# Patient Record
Sex: Male | Born: 2000 | Race: Black or African American | Hispanic: No | Marital: Single | Smoking: Current some day smoker
Health system: Southern US, Community
[De-identification: ages and names within clinical notes are randomized; demographics above are authoritative.]

## PROBLEM LIST (undated history)

## (undated) DIAGNOSIS — F419 Anxiety disorder, unspecified: Secondary | ICD-10-CM

## (undated) DIAGNOSIS — F909 Attention-deficit hyperactivity disorder, unspecified type: Secondary | ICD-10-CM

## (undated) DIAGNOSIS — J45909 Unspecified asthma, uncomplicated: Secondary | ICD-10-CM

## (undated) DIAGNOSIS — Q999 Chromosomal abnormality, unspecified: Secondary | ICD-10-CM

## (undated) DIAGNOSIS — W3400XA Accidental discharge from unspecified firearms or gun, initial encounter: Secondary | ICD-10-CM

## (undated) HISTORY — PX: PELVIC FRACTURE SURGERY: SHX119

---
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
## 2021-01-03 ENCOUNTER — Emergency Department (HOSPITAL_COMMUNITY)
Admission: EM | Admit: 2021-01-03 | Discharge: 2021-01-06 | Disposition: A | Discharge status: Other Acute Inpatient Hospital | Payer: Medicaid Other | Attending: Emergency Medicine | Admitting: Emergency Medicine

## 2021-01-03 DIAGNOSIS — F332 Major depressive disorder, recurrent severe without psychotic features: Secondary | ICD-10-CM | POA: Diagnosis not present

## 2021-01-03 DIAGNOSIS — Y900 Blood alcohol level of less than 20 mg/100 ml: Secondary | ICD-10-CM | POA: Diagnosis not present

## 2021-01-03 DIAGNOSIS — J45909 Unspecified asthma, uncomplicated: Secondary | ICD-10-CM | POA: Diagnosis not present

## 2021-01-03 DIAGNOSIS — R259 Unspecified abnormal involuntary movements: Secondary | ICD-10-CM | POA: Insufficient documentation

## 2021-01-03 DIAGNOSIS — F17200 Nicotine dependence, unspecified, uncomplicated: Secondary | ICD-10-CM | POA: Insufficient documentation

## 2021-01-03 DIAGNOSIS — Z046 Encounter for general psychiatric examination, requested by authority: Secondary | ICD-10-CM

## 2021-01-03 DIAGNOSIS — R45851 Suicidal ideations: Secondary | ICD-10-CM | POA: Diagnosis not present

## 2021-01-03 DIAGNOSIS — F333 Major depressive disorder, recurrent, severe with psychotic symptoms: Secondary | ICD-10-CM | POA: Diagnosis not present

## 2021-01-03 DIAGNOSIS — Z20822 Contact with and (suspected) exposure to covid-19: Secondary | ICD-10-CM | POA: Diagnosis not present

## 2021-01-03 LAB — COMPREHENSIVE METABOLIC PANEL
ALT: 17 U/L (ref 0–44)
AST: 29 U/L (ref 15–41)
Albumin: 4.5 g/dL (ref 3.5–5.0)
Alkaline Phosphatase: 65 U/L (ref 38–126)
Anion gap: 8 (ref 5–15)
BUN: 11 mg/dL (ref 6–20)
CO2: 27 mmol/L (ref 22–32)
Calcium: 9.6 mg/dL (ref 8.9–10.3)
Chloride: 103 mmol/L (ref 98–111)
Creatinine, Ser: 1.27 mg/dL — ABNORMAL HIGH (ref 0.61–1.24)
GFR, Estimated: >60 mL/min (ref >60)
Glucose, Bld: 100 mg/dL — ABNORMAL HIGH (ref 70–99)
Potassium: 4.2 mmol/L (ref 3.5–5.1)
Sodium: 138 mmol/L (ref 135–145)
Total Bilirubin: 0.3 mg/dL (ref 0.3–1.2)
Total Protein: 6.8 g/dL (ref 6.5–8.1)

## 2021-01-03 LAB — CBC WITH DIFFERENTIAL/PLATELET
Abs Immature Granulocytes: 0.01 10*3/uL (ref 0.00–0.07)
Basophils Absolute: 0 10*3/uL (ref 0.0–0.1)
Basophils Relative: 1 %
Eosinophils Absolute: 0.3 10*3/uL (ref 0.0–0.5)
Eosinophils Relative: 6 %
HCT: 42.2 % (ref 39.0–52.0)
Hemoglobin: 14.5 g/dL (ref 13.0–17.0)
Immature Granulocytes: 0 %
Lymphocytes Relative: 46 %
Lymphs Abs: 2.2 10*3/uL (ref 0.7–4.0)
MCH: 27.3 pg (ref 26.0–34.0)
MCHC: 34.4 g/dL (ref 30.0–36.0)
MCV: 79.3 fL — ABNORMAL LOW (ref 80.0–100.0)
Monocytes Absolute: 0.3 10*3/uL (ref 0.1–1.0)
Monocytes Relative: 6 %
Neutro Abs: 2 10*3/uL (ref 1.7–7.7)
Neutrophils Relative %: 41 %
Platelets: 259 10*3/uL (ref 150–400)
RBC: 5.32 MIL/uL (ref 4.22–5.81)
RDW: 13.2 % (ref 11.5–15.5)
WBC: 4.7 10*3/uL (ref 4.0–10.5)
nRBC: 0 % (ref 0.0–0.2)

## 2021-01-03 LAB — ACETAMINOPHEN LEVEL: Acetaminophen (Tylenol), Serum: <10 ug/mL — ABNORMAL LOW (ref 10–30)

## 2021-01-03 LAB — SALICYLATE LEVEL: Salicylate Lvl: <7 mg/dL — ABNORMAL LOW (ref 7.0–30.0)

## 2021-01-03 LAB — ETHANOL: Alcohol, Ethyl (B): <10 mg/dL (ref <10)

## 2021-01-03 NOTE — ED Triage Notes (Signed)
Pt BIB [facility] under IVC from mother, states she is concerned about SI. Pt calm, cooperative in triage. Hx MDD, PTSD, MDD, mother states noncompliance w meds

## 2021-01-03 NOTE — ED Provider Notes (Signed)
Emergency Medicine Provider Triage Evaluation Note  Roy Dudley , a 20 y.o. male  was evaluated in triage.  Pt complains of suicidal ideations.  Prior history of PTSD, MDD, according to mother he is not taking any medication.  He has placed him under IVC.  Prior attempt of SI, consistent with hurting and cutting his arms.  No visual auditory hallucinations.  Review of Systems  Positive: Suicidal ideations Negative: HI, hallucinations.  Physical Exam  BP 113/67 (BP Location: Right Arm)   Pulse 83   Temp 97.8 F (36.6 C) (Oral)   Resp 18   SpO2 98%  Gen:   Awake, no distress  withdrawn Resp:  Normal effort  MSK:   Moves extremities without difficulty  Other:    Medical Decision Making  Medically screening exam initiated at 7:53 PM.  Appropriate orders placed.  Melo Longo was informed that the remainder of the evaluation will be completed by another provider, this initial triage assessment does not replace that evaluation, and the importance of remaining in the ED until their evaluation is complete.  Patient under IVC from mother, prior history of mental health noncompliant with medication.   Claude Manges, PA-C 01/03/21 1955    Margarita Grizzle, MD 01/10/21 [phone]

## 2021-01-04 ENCOUNTER — Encounter (HOSPITAL_COMMUNITY): Payer: Self-pay | Admitting: Registered Nurse

## 2021-01-04 DIAGNOSIS — F333 Major depressive disorder, recurrent, severe with psychotic symptoms: Secondary | ICD-10-CM | POA: Diagnosis not present

## 2021-01-04 LAB — RAPID URINE DRUG SCREEN, HOSP PERFORMED
Amphetamines: NOT DETECTED
Barbiturates: NOT DETECTED
Benzodiazepines: NOT DETECTED
Cocaine: NOT DETECTED
Opiates: NOT DETECTED
Tetrahydrocannabinol: POSITIVE — AB

## 2021-01-04 LAB — RESP PANEL BY RT-PCR (FLU A&B, COVID) ARPGX2
Influenza A by PCR: NEGATIVE
Influenza B by PCR: NEGATIVE
SARS Coronavirus 2 by RT PCR: NEGATIVE

## 2021-01-04 MED ORDER — LORAZEPAM 2 MG/ML IJ SOLN
2.0000 mg | Freq: Once | INTRAMUSCULAR | Status: AC | PRN
  Administered 2021-01-04: 2 mg via INTRAMUSCULAR
  Filled 2021-01-04: qty 1

## 2021-01-04 MED ORDER — OLANZAPINE 5 MG PO TBDP
5.0000 mg | ORAL_TABLET | Freq: Every day | ORAL | Status: DC
  Administered 2021-01-05 – 2021-01-06 (×2): 5 mg via ORAL
  Filled 2021-01-04 (×2): qty 1

## 2021-01-04 MED ORDER — ACETAMINOPHEN 325 MG PO TABS
650.0000 mg | ORAL_TABLET | ORAL | Status: DC | PRN
Start: 2021-01-04 — End: 2021-01-06

## 2021-01-04 MED ORDER — NICOTINE 21 MG/24HR TD PT24
21.0000 mg | MEDICATED_PATCH | Freq: Every day | TRANSDERMAL | Status: DC
  Administered 2021-01-05 – 2021-01-06 (×2): 21 mg via TRANSDERMAL
  Filled 2021-01-04 (×3): qty 1

## 2021-01-04 NOTE — ED Notes (Signed)
Patient made his first phone call.

## 2021-01-04 NOTE — ED Notes (Signed)
Pt very agitated, slamming his door and making very loud noises in his room. Pt was asked to calm down but no success. Security was needed to calm patient down. Pt remained very angry and agitated and was refusing ativan. Security and this RN explained to the patient why ativan is needed. Also was under the impression that he can just sign himself out because he is now "20 years old".Explained to the patient that he has IVC orders which he could not comprehend what it entailed. Talked to MD that patient might need to be held down to give ativan. Medicine was pulled from the pyxis. After a long calm explanations, the patient agreed to getting the ativan. Pt was not held down. Dr. Estell Harpin was informed that ativan was given and that security did not have to restraint him.

## 2021-01-04 NOTE — ED Notes (Signed)
Pt belongings inventoried and placed in locker #8.

## 2021-01-04 NOTE — Progress Notes (Signed)
Pt accepted to [facility]      Patient meets inpatient criteria per Assunta Found, NP   Dr. Minna Merritts the attending provider.    Call report to [phone]   Mariane Duval, RN @ [facility] ED notified.     Pt scheduled  to arrive at [facility] tomorrow after 0800.   Damita Dunnings, MSW, LCSW-A  1:36 PM 01/04/2021

## 2021-01-04 NOTE — ED Notes (Signed)
RN was made aware by staff that pt had phone since being in lobby. RN explained to pt that pt was not allowed to have phone at this time. PT became agitated stating, " I want to fucking leave. You can't keep me here. I want my phone". Pt also stated that, " I will leave." Security called. MD made aware. RN able to redirect pt back to bed.

## 2021-01-04 NOTE — ED Notes (Signed)
Breakfast tray ordered at this time.
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
## 2021-01-04 NOTE — ED Notes (Signed)
Assumed care of pt at this time. Pt lying in bed with eyes closed and respirations even and unlabored. No distress noted, will continue to monitor.

## 2021-01-04 NOTE — B[facility] Counselor (Signed)
Requested cart at 730.

## 2021-01-04 NOTE — ED Notes (Signed)
Sitter reported that pt has a low heart rate. Talked to patient, he is resting comfortably watching TV. Radial pulses are strong and even.

## 2021-01-04 NOTE — ED Notes (Signed)
Pt refused CT and zyprexa. Shuvon Rankin notified via secure chat.

## 2021-01-04 NOTE — ED Notes (Signed)
Messaged Dr. Estell Harpin to discontinue restraint order.

## 2021-01-04 NOTE — ED Notes (Signed)
Another breakfast tray ordered. Pt refused 1st breakfast tray. Pt states food is old.
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
## 2021-01-04 NOTE — Progress Notes (Signed)
Patient has been faxed out due to COVID restrictions at [facility]. Patient meets inpatient criteria per [facility] Rankin,NP. Patient referred to the following facilities: [facility]  [address] [place] [address] [phone]  [facility]  [address] [place] [address] [phone]  [facility]  [address] [place] [address] [phone]  [facility]  [address] [place] [address] [phone]  [facility]  [address] [place], [place] [address] [phone]  [facility]  [address] [place], [place] [address] [phone]  [facility]  [address] [place] [address] [phone]  [facility]  [address], [place] [address] [phone]  [facility]  [address] [place] [address] [phone]     CSW will continue to monitor disposition.    Damita Dunnings, MSW, LCSW-A  1:02 PM 01/04/2021

## 2021-01-05 ENCOUNTER — Emergency Department (HOSPITAL_COMMUNITY): Payer: Medicaid Other

## 2021-01-05 MED ORDER — ZIPRASIDONE MESYLATE 20 MG IM SOLR
20.0000 mg | Freq: Once | INTRAMUSCULAR | Status: DC
  Filled 2021-01-05: qty 20

## 2021-01-05 MED ORDER — STERILE WATER FOR INJECTION IJ SOLN
INTRAMUSCULAR | Status: AC
  Filled 2021-01-05: qty 10

## 2021-01-05 NOTE — ED Notes (Signed)
Patient talking to his mother on the phone at this time.

## 2021-01-05 NOTE — ED Notes (Signed)
Hospital public safety at the bedside. He is yelling and throwing things in his room. He punched the wall with his right hand. Bleeding is controled at this time.

## 2021-01-05 NOTE — ED Notes (Signed)
Patient is out in the hallway trying to call his mother at this time.

## 2021-01-05 NOTE — ED Notes (Signed)
Patient given a Snack and Drink.

## 2021-01-05 NOTE — ED Notes (Signed)
Received verbal report from Andrew F RN at this time 

## 2021-01-05 NOTE — ED Notes (Signed)
ED Provider at bedside. 

## 2021-01-05 NOTE — ED Notes (Signed)
Pt requesting something to drink and crackers with peanut butter at this time

## 2021-01-05 NOTE — ED Notes (Signed)
I attempted to call nurse report to [facility] this morning. RN-Silva said that she cannot take the patient until 24hr after his last IM medication dose. That would be around 3pm today if he doesn't need another shot.

## 2021-01-05 NOTE — ED Notes (Signed)
Patient states that he will calm down and doesn't need the shot of Geodon at this time. I infromed him that is he acts up again he will be getting the shot. Lights turned off and food at the bedside.

## 2021-01-05 NOTE — BH Assessment (Addendum)
Disposition:  Per Shuvon Rankin, NP, Pt meets inpt criteria due to suicidal and homicidal ideation, as well as delusion. Pt accepted to [facility]. Pt scheduled  to arrive at [facility] tomorrow after 0800. The accepting and attending provider is Dr. Minna Merritts. Call report to [phone]. Mariane Duval, RN @ [facility] ED notified.   Upon chart review @0901 , , RN noted: " I attempted to call nurse report to [facility] this morning. RN-Silva said that she cannot take the patient until 24hr after his last IM medication dose". That would be around 3pm today if he doesn't need another shot".   Disposition LCSW and/or patient's nurse to contact [facility] 8/12 to provide updates regarding patient's behavioral status and elgilbility for transfer.

## 2021-01-05 NOTE — ED Provider Notes (Signed)
Emergency Medicine Observation Re-evaluation Note  Roy Dudley is a 20 y.o. male, seen on rounds today.  Pt initially presented to the ED for complaints of IVC and Suicidal (Pt is under IVC (mother is petitioner) for communicating suicidal ideation, homicidal ideation, and exhibiting paranoia.) Currently, the patient is agitated, yelling and cursing because he is being held under IVC.  Physical Exam  BP 119/62 (BP Location: Left Arm)   Pulse (!) 40   Temp 98.3 F (36.8 C) (Oral)   Resp 18   SpO2 100%  Physical Exam General: Agitated Lungs: No distress, yelling and screaming Psych: Agitated, cursing  ED Course / MDM  EKG:   I have reviewed the labs performed to date as well as medications administered while in observation.  Recent changes in the last 24 hours include increased outbursts.  Plan  Current plan is for geodon for sedation. Planning admission to [facility] later today. Dr. Merlene Morse accepting per TTS notes Waymond Caddell is under involuntary commitment.      Pollyann Savoy, MD 01/05/21 [phone]

## 2021-01-06 NOTE — ED Notes (Addendum)
Patient currently talking in the shower by himself.

## 2021-01-06 NOTE — ED Notes (Signed)
Patient to be transported to [facility] under IVC at 1100 today. Patient aware and agreeable to plan.

## 2021-01-06 NOTE — ED Notes (Signed)
Sheriff on unit to transfer pt to [facility] per MD order. Personal property given to sheriff for transport. No s/s of acute distress noted at discharge. Ambulatory off unit in law enforcemnt custody.

## 2021-01-06 NOTE — ED Provider Notes (Signed)
Emergency Medicine Observation Re-evaluation Note  Lysander Calixte is a 20 y.o. male, seen on rounds today.  Pt initially presented to the ED for complaints of IVC and Suicidal (Pt is under IVC (mother is petitioner) for communicating suicidal ideation, homicidal ideation, and exhibiting paranoia.) Currently, the patient is IVC.  Physical Exam  BP 114/63 (BP Location: Left Arm)   Pulse (!) 40   Temp 98.4 F (36.9 C) (Oral)   Resp 16   SpO2 100%  Physical Exam General: wdwn male Cardiac: bradycardia Lungs: cta Psych: manic  ED Course / MDM  EKG:   I have reviewed the labs performed to date as well as medications administered while in observation.  Recent changes in the last 24 hours include patient with bed at [facility].  Plan  Current plan is for transfer to [facility]. Emmet Barrales is under involuntary commitment.      Margarita Grizzle, MD 01/06/21 [phone]

## 2021-05-13 ENCOUNTER — Encounter (HOSPITAL_COMMUNITY): Payer: Self-pay | Admitting: Emergency Medicine

## 2021-05-13 ENCOUNTER — Ambulatory Visit (HOSPITAL_COMMUNITY)
Admission: EM | Admit: 2021-05-13 | Discharge: 2021-05-13 | Disposition: A | Discharge status: Home / Self Care | Payer: Medicaid Other | Attending: Physician Assistant | Admitting: Physician Assistant

## 2021-05-13 ENCOUNTER — Other Ambulatory Visit: Payer: Self-pay

## 2021-05-13 DIAGNOSIS — J069 Acute upper respiratory infection, unspecified: Secondary | ICD-10-CM | POA: Diagnosis not present

## 2021-05-13 DIAGNOSIS — J4521 Mild intermittent asthma with (acute) exacerbation: Secondary | ICD-10-CM | POA: Diagnosis not present

## 2021-05-13 MED ORDER — ALBUTEROL SULFATE HFA 108 (90 BASE) MCG/ACT IN AERS
INHALATION_SPRAY | RESPIRATORY_TRACT | Status: AC
  Filled 2021-05-13: qty 6.7

## 2021-05-13 MED ORDER — ALBUTEROL SULFATE HFA 108 (90 BASE) MCG/ACT IN AERS
2.0000 | INHALATION_SPRAY | Freq: Once | RESPIRATORY_TRACT | Status: AC
Start: 2021-05-13 — End: 2021-05-13
  Administered 2021-05-13: 2 via RESPIRATORY_TRACT

## 2021-05-13 MED ORDER — PREDNISONE 20 MG PO TABS: 20.0000 mg | ORAL_TABLET | Freq: Every day | ORAL | 0 refills | Status: AC

## 2021-05-13 NOTE — ED Triage Notes (Signed)
Patient c/o nonproductive cough x 3 days.   Patient denies fever at home. Patient denies N/V/D.   Patient endorses loss of taste and smell.   Patient has taken ibuprofen with no relief of symptoms.

## 2021-05-13 NOTE — Discharge Instructions (Addendum)
I believe that you have a virus that is exacerbating your asthma.  Please use albuterol inhaler every 4-6 hours as needed.  Take prednisone 20 mg in the morning for 3 days.  You should not take NSAIDs including aspirin, ibuprofen/Advil, naproxen/Aleve with this medication as it can cause stomach bleeding.  You can use Mucinex, Tylenol, Flonase for additional symptom relief.  Make sure you rest and drink plenty of fluid.  If you have any worsening symptoms including high fever, shortness of breath, chest pain, worsening cough, nausea, vomiting, weakness you need to be seen immediately.
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
## 2022-03-21 ENCOUNTER — Emergency Department (HOSPITAL_COMMUNITY)
Admission: EM | Admit: 2022-03-21 | Discharge: 2022-03-23 | Disposition: A | Discharge status: Home / Self Care | Payer: Medicaid Other | Attending: Emergency Medicine | Admitting: Emergency Medicine

## 2022-03-21 ENCOUNTER — Other Ambulatory Visit: Payer: Self-pay

## 2022-03-21 ENCOUNTER — Encounter (HOSPITAL_COMMUNITY): Payer: Self-pay | Admitting: Emergency Medicine

## 2022-03-21 DIAGNOSIS — F4310 Post-traumatic stress disorder, unspecified: Secondary | ICD-10-CM | POA: Diagnosis present

## 2022-03-21 DIAGNOSIS — F22 Delusional disorders: Secondary | ICD-10-CM | POA: Insufficient documentation

## 2022-03-21 DIAGNOSIS — F29 Unspecified psychosis not due to a substance or known physiological condition: Secondary | ICD-10-CM

## 2022-03-21 DIAGNOSIS — Z79899 Other long term (current) drug therapy: Secondary | ICD-10-CM | POA: Diagnosis not present

## 2022-03-21 DIAGNOSIS — Z20822 Contact with and (suspected) exposure to covid-19: Secondary | ICD-10-CM | POA: Diagnosis not present

## 2022-03-21 DIAGNOSIS — Z046 Encounter for general psychiatric examination, requested by authority: Secondary | ICD-10-CM | POA: Diagnosis present

## 2022-03-21 LAB — CBC
HCT: 42.4 % (ref 39.0–52.0)
Hemoglobin: 14.7 g/dL (ref 13.0–17.0)
MCH: 27.2 pg (ref 26.0–34.0)
MCHC: 34.7 g/dL (ref 30.0–36.0)
MCV: 78.4 fL — ABNORMAL LOW (ref 80.0–100.0)
Platelets: 228 10*3/uL (ref 150–400)
RBC: 5.41 MIL/uL (ref 4.22–5.81)
RDW: 13.3 % (ref 11.5–15.5)
WBC: 8 10*3/uL (ref 4.0–10.5)
nRBC: 0 % (ref 0.0–0.2)

## 2022-03-21 LAB — COMPREHENSIVE METABOLIC PANEL
ALT: 19 U/L (ref 0–44)
AST: 53 U/L — ABNORMAL HIGH (ref 15–41)
Albumin: 4.7 g/dL (ref 3.5–5.0)
Alkaline Phosphatase: 57 U/L (ref 38–126)
Anion gap: 11 (ref 5–15)
BUN: 13 mg/dL (ref 6–20)
CO2: 23 mmol/L (ref 22–32)
Calcium: 9.6 mg/dL (ref 8.9–10.3)
Chloride: 107 mmol/L (ref 98–111)
Creatinine, Ser: 1.21 mg/dL (ref 0.61–1.24)
GFR, Estimated: >60 mL/min (ref >60)
Glucose, Bld: 79 mg/dL (ref 70–99)
Potassium: 3.7 mmol/L (ref 3.5–5.1)
Sodium: 141 mmol/L (ref 135–145)
Total Bilirubin: 1 mg/dL (ref 0.3–1.2)
Total Protein: 7 g/dL (ref 6.5–8.1)

## 2022-03-21 LAB — RAPID URINE DRUG SCREEN, HOSP PERFORMED
Amphetamines: NOT DETECTED
Barbiturates: NOT DETECTED
Benzodiazepines: NOT DETECTED
Cocaine: NOT DETECTED
Opiates: NOT DETECTED
Tetrahydrocannabinol: POSITIVE — AB

## 2022-03-21 LAB — URINALYSIS, ROUTINE W REFLEX MICROSCOPIC
Bilirubin Urine: NEGATIVE
Glucose, UA: NEGATIVE mg/dL
Hgb urine dipstick: NEGATIVE
Ketones, ur: 20 mg/dL — AB
Leukocytes,Ua: NEGATIVE
Nitrite: NEGATIVE
Protein, ur: 100 mg/dL — AB
Specific Gravity, Urine: 1.024 (ref 1.005–1.030)
pH: 5 (ref 5.0–8.0)

## 2022-03-21 LAB — ETHANOL: Alcohol, Ethyl (B): <10 mg/dL (ref <10)
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
## 2022-03-21 NOTE — ED Notes (Signed)
IVC PAPERWORK COMPLETED AND PLACED ON PURPLE CLIPBOARD IN ORANGE ZONE.
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
## 2022-03-21 NOTE — ED Notes (Signed)
Pt making phone call to update family an status. Pt is calm and cooperative Does not appear in any distress
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
## 2022-03-22 DIAGNOSIS — F29 Unspecified psychosis not due to a substance or known physiological condition: Secondary | ICD-10-CM

## 2022-03-22 DIAGNOSIS — F4310 Post-traumatic stress disorder, unspecified: Secondary | ICD-10-CM

## 2022-03-22 LAB — RESP PANEL BY RT-PCR (FLU A&B, COVID) ARPGX2
Influenza A by PCR: NEGATIVE
Influenza B by PCR: NEGATIVE
SARS Coronavirus 2 by RT PCR: NEGATIVE

## 2022-03-22 MED ORDER — LORAZEPAM 1 MG PO TABS
1.0000 mg | ORAL_TABLET | Freq: Four times a day (QID) | ORAL | Status: DC | PRN
  Administered 2022-03-22: 1 mg via ORAL
  Filled 2022-03-22: qty 1

## 2022-03-22 MED ORDER — LORAZEPAM 2 MG/ML IJ SOLN: 1.0000 mg | Freq: Four times a day (QID) | INTRAMUSCULAR | Status: DC | PRN

## 2022-03-22 MED ORDER — PRAZOSIN HCL 2 MG PO CAPS
3.0000 mg | ORAL_CAPSULE | Freq: Every day | ORAL | Status: DC
  Filled 2022-03-22: qty 1

## 2022-03-22 MED ORDER — OLANZAPINE 5 MG PO TBDP
5.0000 mg | ORAL_TABLET | Freq: Every day | ORAL | Status: DC
  Administered 2022-03-22: 5 mg via ORAL
  Filled 2022-03-22: qty 1

## 2022-03-22 MED ORDER — HYDROXYZINE HCL 25 MG PO TABS
25.0000 mg | ORAL_TABLET | Freq: Three times a day (TID) | ORAL | Status: DC | PRN
  Administered 2022-03-22 (×2): 25 mg via ORAL
  Filled 2022-03-22 (×2): qty 1

## 2022-03-22 MED ORDER — ZIPRASIDONE MESYLATE 20 MG IM SOLR: 20.0000 mg | Freq: Two times a day (BID) | INTRAMUSCULAR | Status: DC | PRN

## 2022-03-22 NOTE — ED Notes (Signed)
Pt has now made 2 phone calls and is notified that he cannot have anymore today

## 2022-03-22 NOTE — ED Notes (Signed)
Pt requested PRN anxiety medication. Pt provided medication, food, drink, and warm blankets. Pt calm and cooperative at this time, normal conversational tone.

## 2022-03-22 NOTE — ED Notes (Signed)
Pt showered and changed into new scrubs. Linen changed
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
## 2022-03-22 NOTE — Progress Notes (Addendum)
Inpatient Behavioral Health  Pt meets inpatient criteria per Molli Barrows,  NP.  Referral was sent to the following facilities;    Service Provider Address Phone Fax  [facility]  [address], [place] [address] [phone] [place]  [place], [place] [address] [phone]  [facility]  [address] [place] [address] [place]  [facility]  [address], [place] [address] [phone]  [place] [address] [place] [address] [phone]  [facility]  [address], [place] [address] [phone] [facility]  [address], [place] [address] [phone]  [facility]  [address], [place] [address] [phone] [facility]  [address], [place] [address] [phone]  [facility]  [address] [place] [address] [phone]   Situation ongoing,  CSW will follow up.   Benjaman Kindler, MSW, LCSWA 03/22/2022  @ 1:42 PM

## 2022-03-22 NOTE — Progress Notes (Signed)
Pt has been denied  at [facility] due to IDD. CSW will assist and follow with placement.   Benjaman Kindler, MSW, [facility] 03/22/2022 9:47 PM

## 2022-03-22 NOTE — ED Notes (Signed)
Review of patient clipboard in orange zone (patient currently in [facility]) indicates IVC'd on 03/21/2022 with an expiration of 03/28/2022.

## 2022-03-23 ENCOUNTER — Encounter (HOSPITAL_COMMUNITY): Payer: Self-pay | Admitting: *Deleted

## 2022-03-23 ENCOUNTER — Emergency Department (HOSPITAL_COMMUNITY)
Admission: EM | Admit: 2022-03-23 | Discharge: 2022-03-26 | Disposition: A | Discharge status: Home / Self Care | Payer: Medicaid Other | Attending: Emergency Medicine | Admitting: Emergency Medicine

## 2022-03-23 ENCOUNTER — Other Ambulatory Visit: Payer: Self-pay

## 2022-03-23 DIAGNOSIS — R45851 Suicidal ideations: Secondary | ICD-10-CM | POA: Insufficient documentation

## 2022-03-23 DIAGNOSIS — F4310 Post-traumatic stress disorder, unspecified: Secondary | ICD-10-CM | POA: Diagnosis present

## 2022-03-23 DIAGNOSIS — Z20822 Contact with and (suspected) exposure to covid-19: Secondary | ICD-10-CM | POA: Diagnosis not present

## 2022-03-23 DIAGNOSIS — F22 Delusional disorders: Secondary | ICD-10-CM | POA: Diagnosis not present

## 2022-03-23 DIAGNOSIS — F29 Unspecified psychosis not due to a substance or known physiological condition: Secondary | ICD-10-CM | POA: Diagnosis present

## 2022-03-23 DIAGNOSIS — Z046 Encounter for general psychiatric examination, requested by authority: Secondary | ICD-10-CM | POA: Diagnosis not present

## 2022-03-23 DIAGNOSIS — R456 Violent behavior: Secondary | ICD-10-CM | POA: Insufficient documentation

## 2022-03-23 DIAGNOSIS — R451 Restlessness and agitation: Secondary | ICD-10-CM | POA: Diagnosis not present

## 2022-03-23 LAB — URINALYSIS, ROUTINE W REFLEX MICROSCOPIC
Bilirubin Urine: NEGATIVE
Glucose, UA: NEGATIVE mg/dL
Hgb urine dipstick: NEGATIVE
Ketones, ur: NEGATIVE mg/dL
Leukocytes,Ua: NEGATIVE
Nitrite: NEGATIVE
Protein, ur: >=300 mg/dL — AB
Specific Gravity, Urine: 1.024 (ref 1.005–1.030)
pH: 5 (ref 5.0–8.0)

## 2022-03-23 LAB — SALICYLATE LEVEL: Salicylate Lvl: <7 mg/dL — ABNORMAL LOW (ref 7.0–30.0)

## 2022-03-23 LAB — COMPREHENSIVE METABOLIC PANEL
ALT: 18 U/L (ref 0–44)
AST: 35 U/L (ref 15–41)
Albumin: 4.4 g/dL (ref 3.5–5.0)
Alkaline Phosphatase: 63 U/L (ref 38–126)
Anion gap: 7 (ref 5–15)
BUN: 10 mg/dL (ref 6–20)
CO2: 29 mmol/L (ref 22–32)
Calcium: 9.6 mg/dL (ref 8.9–10.3)
Chloride: 107 mmol/L (ref 98–111)
Creatinine, Ser: 1.19 mg/dL (ref 0.61–1.24)
GFR, Estimated: >60 mL/min (ref >60)
Glucose, Bld: 86 mg/dL (ref 70–99)
Potassium: 3.5 mmol/L (ref 3.5–5.1)
Sodium: 143 mmol/L (ref 135–145)
Total Bilirubin: 0.7 mg/dL (ref 0.3–1.2)
Total Protein: 7 g/dL (ref 6.5–8.1)

## 2022-03-23 LAB — CBC WITH DIFFERENTIAL/PLATELET
Abs Immature Granulocytes: 0.01 10*3/uL (ref 0.00–0.07)
Basophils Absolute: 0 10*3/uL (ref 0.0–0.1)
Basophils Relative: 1 %
Eosinophils Absolute: 0.1 10*3/uL (ref 0.0–0.5)
Eosinophils Relative: 2 %
HCT: 42.5 % (ref 39.0–52.0)
Hemoglobin: 14.6 g/dL (ref 13.0–17.0)
Immature Granulocytes: 0 %
Lymphocytes Relative: 42 %
Lymphs Abs: 2.8 10*3/uL (ref 0.7–4.0)
MCH: 26.9 pg (ref 26.0–34.0)
MCHC: 34.4 g/dL (ref 30.0–36.0)
MCV: 78.4 fL — ABNORMAL LOW (ref 80.0–100.0)
Monocytes Absolute: 0.4 10*3/uL (ref 0.1–1.0)
Monocytes Relative: 6 %
Neutro Abs: 3.3 10*3/uL (ref 1.7–7.7)
Neutrophils Relative %: 49 %
Platelets: 257 10*3/uL (ref 150–400)
RBC: 5.42 MIL/uL (ref 4.22–5.81)
RDW: 13.3 % (ref 11.5–15.5)
WBC: 6.7 10*3/uL (ref 4.0–10.5)
nRBC: 0 % (ref 0.0–0.2)

## 2022-03-23 LAB — RAPID URINE DRUG SCREEN, HOSP PERFORMED
Amphetamines: NOT DETECTED
Barbiturates: NOT DETECTED
Benzodiazepines: POSITIVE — AB
Cocaine: NOT DETECTED
Opiates: NOT DETECTED
Tetrahydrocannabinol: POSITIVE — AB

## 2022-03-23 LAB — ACETAMINOPHEN LEVEL: Acetaminophen (Tylenol), Serum: <10 ug/mL — ABNORMAL LOW (ref 10–30)

## 2022-03-23 LAB — RESP PANEL BY RT-PCR (FLU A&B, COVID) ARPGX2
Influenza A by PCR: NEGATIVE
Influenza B by PCR: NEGATIVE
SARS Coronavirus 2 by RT PCR: NEGATIVE

## 2022-03-23 LAB — ETHANOL: Alcohol, Ethyl (B): <10 mg/dL (ref <10)

## 2022-03-23 MED ORDER — LORAZEPAM 2 MG/ML IJ SOLN
2.0000 mg | Freq: Once | INTRAMUSCULAR | Status: AC
  Administered 2022-03-23: 2 mg via INTRAMUSCULAR
  Filled 2022-03-23: qty 1

## 2022-03-23 MED ORDER — HALOPERIDOL LACTATE 5 MG/ML IJ SOLN
5.0000 mg | Freq: Once | INTRAMUSCULAR | Status: AC
  Administered 2022-03-23: 5 mg via INTRAMUSCULAR
  Filled 2022-03-23: qty 1

## 2022-03-23 MED ORDER — DIPHENHYDRAMINE HCL 50 MG/ML IJ SOLN
25.0000 mg | Freq: Once | INTRAMUSCULAR | Status: AC
  Administered 2022-03-23: 25 mg via INTRAMUSCULAR
  Filled 2022-03-23: qty 1

## 2022-03-23 NOTE — ED Notes (Signed)
Patient is resting comfortably. 

## 2022-03-23 NOTE — ED Notes (Signed)
Around 8:45 patient asked me to warm his food up , he than asked to use the phone , let him use the phone and he went back to his bed , around 930 I seen patient was not in his bed , I alert his nurse Paige RN and security than gpd , patient did not have purple scrubs on due to purple zone being locked up. Everyone made aware of patient missing.

## 2022-03-23 NOTE — ED Notes (Signed)
IVC paperwork complete and in orange zone 

## 2022-03-23 NOTE — ED Notes (Signed)
THE PT IS IVCD ???

## 2022-03-23 NOTE — ED Notes (Addendum)
Pt yelling at this RN and closing the door of other patients room. This RN requested that pt stop, pt states "you can't make me". Pt stating that the hospital has lost $300 shoes, new iphone, and clothing. EMT Lane attempting to locate personal belongings from previous visit when pt eloped. MD Trifan made aware of pts behaviors. Security to bedside.
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
## 2022-03-23 NOTE — ED Provider Triage Note (Cosign Needed Addendum)
Emergency Medicine Provider Triage Evaluation Note  Roy Dudley , a 21 y.o. male  was evaluated in triage.  Pt complains of SI states plan to shoot himself in the head. States he feels hopeless.   Hx of cutting. No hx of attempts of suicide.   Denies ingestion in attempt to self harm. Denies any attempt to kill himself before arrival at ED.   Also some burning occasionally when he pees. Recent sexual activity.   Review of Systems  Positive: SI Negative: Fever   Physical Exam  BP 115/66   Pulse 63   Temp 98.6 F (37 C) (Oral)   Resp 18   SpO2 97%  Gen:   Awake, no distress   Resp:  Normal effort  MSK:   Moves extremities without difficulty Other:    Medical Decision Making  Medically screening exam initiated at 8:02 PM.  Appropriate orders placed.  Roy Dudley was informed that the remainder of the evaluation will be completed by another provider, this initial triage assessment does not replace that evaluation, and the importance of remaining in the ED until their evaluation is complete.  Psych clearance labs     Tedd Sias, [place] 03/23/22 2004    Pati Gallo [place], [place] 03/23/22 2005

## 2022-03-23 NOTE — BH Assessment (Signed)
TTS attempted to see will follow up with Pt's nurse. 
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
## 2022-03-23 NOTE — ED Triage Notes (Signed)
THE PT  IS HERE WITH SI  HE APPARENTLY WAS HERE 10-26  AND ??? LEFT he came back today and he left to see his mother who is upstairs  he is ivcd   we are unable to find his belongings he is dressed out.  His plan is to step in front of  [employer]

## 2022-03-24 DIAGNOSIS — Z046 Encounter for general psychiatric examination, requested by authority: Secondary | ICD-10-CM | POA: Insufficient documentation

## 2022-03-24 DIAGNOSIS — F29 Unspecified psychosis not due to a substance or known physiological condition: Secondary | ICD-10-CM | POA: Diagnosis not present

## 2022-03-24 DIAGNOSIS — F4310 Post-traumatic stress disorder, unspecified: Secondary | ICD-10-CM | POA: Diagnosis not present

## 2022-03-24 MED ORDER — TRAZODONE HCL 50 MG PO TABS
50.0000 mg | ORAL_TABLET | Freq: Every day | ORAL | Status: DC
  Administered 2022-03-24 – 2022-03-25 (×2): 50 mg via ORAL
  Filled 2022-03-24 (×2): qty 1

## 2022-03-24 MED ORDER — PRAZOSIN HCL 1 MG PO CAPS
1.0000 mg | ORAL_CAPSULE | Freq: Every day | ORAL | Status: DC
  Administered 2022-03-24 – 2022-03-25 (×2): 1 mg via ORAL
  Filled 2022-03-24 (×3): qty 1

## 2022-03-24 MED ORDER — LORAZEPAM 1 MG PO TABS
1.0000 mg | ORAL_TABLET | Freq: Once | ORAL | Status: AC
  Administered 2022-03-24: 1 mg via ORAL
  Filled 2022-03-24: qty 1

## 2022-03-24 MED ORDER — OLANZAPINE 10 MG IM SOLR
5.0000 mg | Freq: Once | INTRAMUSCULAR | Status: AC
  Administered 2022-03-24: 5 mg via INTRAMUSCULAR
  Filled 2022-03-24 (×2): qty 10

## 2022-03-24 MED ORDER — OLANZAPINE 5 MG PO TBDP
5.0000 mg | ORAL_TABLET | Freq: Every day | ORAL | Status: DC
  Administered 2022-03-24 – 2022-03-25 (×2): 5 mg via ORAL
  Filled 2022-03-24 (×2): qty 1

## 2022-03-24 MED ORDER — HYDROXYZINE HCL 25 MG PO TABS
25.0000 mg | ORAL_TABLET | Freq: Three times a day (TID) | ORAL | Status: AC
  Administered 2022-03-24 (×2): 25 mg via ORAL
  Filled 2022-03-24 (×2): qty 1

## 2022-03-24 NOTE — ED Notes (Signed)
Patient given phone privileges at this time

## 2022-03-24 NOTE — ED Notes (Signed)
Pt returned to H20 at this time accompanied by [employer] and Security

## 2022-03-24 NOTE — ED Notes (Addendum)
Pt took a shower, scrubs changed, linen changed. Pt is now sitting at bedside.

## 2022-03-24 NOTE — ED Notes (Signed)
Patient currently making a phone call

## 2022-03-24 NOTE — ED Notes (Signed)
Charge nurse previously aware of pt being elopement risk. Pt did not have a sitter after 1100. Unable to move pt. Pt's primary nurse any myself were in another room assisting a critical patient and were notified that pt had eloped. Security notified and attempting to locate patient outside. Staffing called and no sitters available at this time. AC also called and notified of need for a sitter for IVC patient. Awaiting pt's return to ED.

## 2022-03-24 NOTE — ED Notes (Signed)
Patient currently calm and cooperative at this time.  Provided with ginger ale and graham crackers per request

## 2022-03-24 NOTE — ED Notes (Signed)
Patient alert and loud in the hallway Patient talking to anyone that is passing by. Patient is talking about getting out in 2 days and he does not care if we "let him leave"

## 2022-03-24 NOTE — Progress Notes (Signed)
Inpatient Behavioral Health Placement  Pt meets inpatient criteria per Molli Barrows, FNP. There are no available beds at [facility] per [facility] Lavell Luster, RN.   Referral was sent to the following facilities;   Destination Service Provider Address Phone Fax  [facility]  [place], [place] [address] [place]  [facility]  [address] [place] [address] [phone] [place]  [place], [place] [address] [place]  [facility]  [address], [place] [address] [phone]  [place] [address] [place] [address] [phone]  [facility]  [address], [place] [address] [phone] [place]  [address], [place] [address] [phone] [facility]  [address], [place] [address] [phone] [facility]  [address] [place] [address] [place]  [address] [place] [address] [phone] [facility]  [address], [place] [address] [phone] [facility]  [address] [place] [address] [phone] [facility]  [address], [place] [address] [phone] [facility]  [place], [place] [address] [phone]  [facility]  [address] [place] [address] [place]  [place], [place] [address] [phone]  [facility]  [place] [address] [place] [address] [phone] [facility]  [place] [address] [phone]  [facility]  [address] [place] [address] [phone]  [facility]  [address], [place] [address] [phone]  [facility]  [address] [place] [address] [phone] [facility]  [address] [place]  [address] [phone] [facility]  [address] [place] [address] [phone]    Situation ongoing,  CSW will follow up.   Benjaman Kindler, MSW, [facility] 03/24/2022  @ 11:38 PM
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
## 2022-03-24 NOTE — ED Notes (Signed)
Patient changed into burgundy scrubs. 

## 2022-03-24 NOTE — ED Notes (Signed)
Patient using phone at this time to speak to family member

## 2022-03-25 MED ORDER — HYDROXYZINE HCL 25 MG PO TABS
25.0000 mg | ORAL_TABLET | Freq: Three times a day (TID) | ORAL | Status: DC | PRN
  Administered 2022-03-25 (×2): 25 mg via ORAL
  Filled 2022-03-25 (×2): qty 1
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
## 2022-03-25 NOTE — ED Provider Notes (Signed)
Emergency Medicine Observation Re-evaluation Note  Etheridge Geil is a 21 y.o. male, seen on rounds today.  Pt initially presented to the ED for complaints of Psychiatric Evaluation Currently, the patient is Resting in bed .  Physical Exam  BP (!) 105/97 (BP Location: Left Arm)   Pulse 64   Temp 98 F (36.7 C) (Oral)   Resp 16   Ht 5\' 11"  (1.803 m)   Wt 65.8 kg   SpO2 100%   BMI 20.23 kg/m  Physical Exam General: Sleeping but arouses to door opening Lungs: Resp even and unlabored Psych: Calm and cooperative  ED Course / MDM  EKG:EKG Interpretation  Date/Time:  Saturday March 24 2022 08:57:33 EDT Ventricular Rate:  45 PR Interval:  164 QRS Duration: 98 QT Interval:  418 QTC Calculation: 361 R Axis:   84 Text Interpretation: Sinus bradycardia with sinus arrhythmia ST elevation, consider early repolarization Nonspecific ST and T wave abnormality Abnormal ECG When compared with ECG of 22-Mar-2022 13:01, PREVIOUS ECG IS PRESENT suspect early repolarization No significant change since last tracing Confirmed by Georgina Snell [id number]) on 03/24/2022 9:18:32 AM  I have reviewed the labs performed to date as well as medications administered while in observation.  Recent changes in the last 24 hours include none.  Plan  Current plan is for awaiting inpatient placement. Has eloped previously, currently under IVC.     Truddie Hidden, MD 03/25/22 1019

## 2022-03-25 NOTE — Progress Notes (Signed)
Inpatient Behavioral Health Placement  Pt meets inpatient criteria per Molli Barrows, FNP. There are no available beds at [facility].  Referral was sent to the following facilities;   Destination Service Provider Address Phone Fax  [facility]  [place], [place] [address] [address]  [facility]  [address] [place] [address] [phone] [place]  [place], [place] [address] [place]  [facility]  [address], [place] [address] [phone]  [place] [address], [place] [address] [phone]  [facility]  [address] [place] [address] [phone] [place]  [address] [place] [address] [phone] [facility]  [address] [place] [address] [phone] [facility]  [address] [place] [address] [place]  [address] [place] [address] [phone] [facility]  [address] [place] [address] [phone] [facility]  [address] [place] [address] [phone] [facility]  [address], [place] [phone] [facility]  [place], [place] [address] [phone]  [facility]  [address] [place] [address] [place]  [place], [place] [address] [phone]  [facility]  [place]  [place] [address] [phone] [facility]  [place] [address] [phone]  [facility]  [address], [place] [address] [phone]  [facility]  [address], [place] [address] [phone]  [facility]  [address] [place]  [address] [phone] [facility]  [address] [place] [address] [phone] [facility]  [address] [place] [phone]  [facility]  [phone] [address] [place] [address] [phone]  [address]  [address] [place] [address] [phone]    Situation ongoing,  CSW will follow up.   Benjaman Kindler, MSW, [facility] 03/25/2022  @ 11:27 PM

## 2022-03-26 ENCOUNTER — Inpatient Hospital Stay (HOSPITAL_COMMUNITY)
Admission: AD | Admit: 2022-03-26 | Discharge: 2022-04-04 | DRG: 885 | Payer: Medicaid Other | Source: Intra-hospital | Attending: Psychiatry | Admitting: Psychiatry

## 2022-03-26 DIAGNOSIS — T43226A Underdosing of selective serotonin reuptake inhibitors, initial encounter: Secondary | ICD-10-CM | POA: Diagnosis present

## 2022-03-26 DIAGNOSIS — Z818 Family history of other mental and behavioral disorders: Secondary | ICD-10-CM

## 2022-03-26 DIAGNOSIS — F22 Delusional disorders: Secondary | ICD-10-CM | POA: Diagnosis not present

## 2022-03-26 DIAGNOSIS — Q9388 Other microdeletions: Secondary | ICD-10-CM | POA: Diagnosis not present

## 2022-03-26 DIAGNOSIS — F333 Major depressive disorder, recurrent, severe with psychotic symptoms: Secondary | ICD-10-CM | POA: Diagnosis present

## 2022-03-26 DIAGNOSIS — F1210 Cannabis abuse, uncomplicated: Secondary | ICD-10-CM | POA: Diagnosis present

## 2022-03-26 DIAGNOSIS — Z811 Family history of alcohol abuse and dependence: Secondary | ICD-10-CM

## 2022-03-26 DIAGNOSIS — Z8249 Family history of ischemic heart disease and other diseases of the circulatory system: Secondary | ICD-10-CM | POA: Diagnosis not present

## 2022-03-26 DIAGNOSIS — Z23 Encounter for immunization: Secondary | ICD-10-CM | POA: Diagnosis not present

## 2022-03-26 DIAGNOSIS — F1290 Cannabis use, unspecified, uncomplicated: Secondary | ICD-10-CM

## 2022-03-26 DIAGNOSIS — F17210 Nicotine dependence, cigarettes, uncomplicated: Secondary | ICD-10-CM | POA: Diagnosis present

## 2022-03-26 DIAGNOSIS — Z833 Family history of diabetes mellitus: Secondary | ICD-10-CM | POA: Diagnosis not present

## 2022-03-26 DIAGNOSIS — F17290 Nicotine dependence, other tobacco product, uncomplicated: Secondary | ICD-10-CM | POA: Diagnosis present

## 2022-03-26 DIAGNOSIS — F909 Attention-deficit hyperactivity disorder, unspecified type: Secondary | ICD-10-CM | POA: Diagnosis present

## 2022-03-26 DIAGNOSIS — F913 Oppositional defiant disorder: Secondary | ICD-10-CM | POA: Diagnosis present

## 2022-03-26 DIAGNOSIS — F4310 Post-traumatic stress disorder, unspecified: Secondary | ICD-10-CM | POA: Diagnosis present

## 2022-03-26 DIAGNOSIS — R011 Cardiac murmur, unspecified: Secondary | ICD-10-CM | POA: Diagnosis present

## 2022-03-26 DIAGNOSIS — F819 Developmental disorder of scholastic skills, unspecified: Secondary | ICD-10-CM | POA: Diagnosis present

## 2022-03-26 DIAGNOSIS — F411 Generalized anxiety disorder: Secondary | ICD-10-CM | POA: Diagnosis present

## 2022-03-26 DIAGNOSIS — T446X6A Underdosing of alpha-adrenoreceptor antagonists, initial encounter: Secondary | ICD-10-CM | POA: Diagnosis present

## 2022-03-26 DIAGNOSIS — Z653 Problems related to other legal circumstances: Secondary | ICD-10-CM | POA: Diagnosis not present

## 2022-03-26 DIAGNOSIS — Z832 Family history of diseases of the blood and blood-forming organs and certain disorders involving the immune mechanism: Secondary | ICD-10-CM

## 2022-03-26 DIAGNOSIS — Z91128 Patient's intentional underdosing of medication regimen for other reason: Secondary | ICD-10-CM

## 2022-03-26 DIAGNOSIS — J45909 Unspecified asthma, uncomplicated: Secondary | ICD-10-CM | POA: Diagnosis present

## 2022-03-26 DIAGNOSIS — R45851 Suicidal ideations: Secondary | ICD-10-CM | POA: Diagnosis present

## 2022-03-26 DIAGNOSIS — M25551 Pain in right hip: Secondary | ICD-10-CM | POA: Diagnosis present

## 2022-03-26 DIAGNOSIS — Z79899 Other long term (current) drug therapy: Secondary | ICD-10-CM | POA: Diagnosis not present

## 2022-03-26 DIAGNOSIS — Z046 Encounter for general psychiatric examination, requested by authority: Secondary | ICD-10-CM | POA: Diagnosis not present

## 2022-03-26 DIAGNOSIS — F29 Unspecified psychosis not due to a substance or known physiological condition: Secondary | ICD-10-CM | POA: Diagnosis not present

## 2022-03-26 MED ORDER — LORAZEPAM 1 MG PO TABS
1.0000 mg | ORAL_TABLET | ORAL | Status: AC | PRN
  Administered 2022-03-27: 1 mg via ORAL
  Filled 2022-03-26: qty 1

## 2022-03-26 MED ORDER — TRAZODONE HCL 50 MG PO TABS
50.0000 mg | ORAL_TABLET | Freq: Every evening | ORAL | Status: DC | PRN
  Administered 2022-03-26 – 2022-03-31 (×4): 50 mg via ORAL
  Filled 2022-03-26 (×4): qty 1

## 2022-03-26 MED ORDER — OLANZAPINE 5 MG PO TABS
5.0000 mg | ORAL_TABLET | Freq: Every day | ORAL | Status: DC
  Administered 2022-03-26 – 2022-03-27 (×2): 5 mg via ORAL
  Filled 2022-03-26 (×4): qty 1

## 2022-03-26 MED ORDER — ALUM & MAG HYDROXIDE-SIMETH 200-200-20 MG/5ML PO SUSP: 30.0000 mL | ORAL | Status: DC | PRN

## 2022-03-26 MED ORDER — ZIPRASIDONE MESYLATE 20 MG IM SOLR: 20.0000 mg | Freq: Two times a day (BID) | INTRAMUSCULAR | Status: DC | PRN

## 2022-03-26 MED ORDER — ACETAMINOPHEN 325 MG PO TABS
650.0000 mg | ORAL_TABLET | Freq: Four times a day (QID) | ORAL | Status: DC | PRN
  Administered 2022-03-26 – 2022-04-03 (×9): 650 mg via ORAL
  Filled 2022-03-26 (×8): qty 2

## 2022-03-26 MED ORDER — MAGNESIUM HYDROXIDE 400 MG/5ML PO SUSP: 30.0000 mL | Freq: Every day | ORAL | Status: DC | PRN

## 2022-03-26 NOTE — ED Notes (Addendum)
Patient belongings given to [facility]. Patient was escorted by [employer] to [facility].

## 2022-03-26 NOTE — Plan of Care (Signed)
  Problem: Coping: Goal: Coping ability will improve Outcome: Progressing Goal: Will verbalize feelings Outcome: Progressing   

## 2022-03-26 NOTE — Consult Note (Addendum)
Roy Dudley is currently under involuntary commitment. patient has been accepted to 503-1 to the services of Dr. Caswell Corwin and may arrive anytime after 1 pm per [facility].  RN to call report to [phone]

## 2022-03-26 NOTE — Progress Notes (Signed)
Pt was accepted to [facility] Today 03/26/22; Bed Assignment 503-1  Pt meets inpatient criteria per Ricky Ala, NP  Attending Physician will be Dr. Caswell Corwin  Report can be called to:  Adult unit: [phone]  Pt can arrive after 1:00pm  Care Team notified [place], RN, D'Erika Varenhorst, RN, Ricky Ala, NP.     Nadara Mode, [place] 03/26/2022 @ 12:35 PM

## 2022-03-26 NOTE — Tx Team (Signed)
Initial Treatment Plan 03/26/2022 4:24 PM Roy Dudley UTM:[id number]    PATIENT STRESSORS: Financial difficulties     PATIENT STRENGTHS: Supportive family/friends  Work skills    PATIENT IDENTIFIED PROBLEMS: Anxiety  Depression  Hopeless  Panic attack  Crying spell  Appetite decrease.  Loneliness         DISCHARGE CRITERIA:  Safe-care adequate arrangements made Verbal commitment to aftercare and medication compliance  PRELIMINARY DISCHARGE PLAN: Outpatient therapy Return to previous work or school arrangements  PATIENT/FAMILY INVOLVEMENT: This treatment plan has been presented to and reviewed with the patient, Roy Dudley,  The patient has been given the opportunity to ask questions and make suggestions.  Dorris Carnes, RN 03/26/2022, 4:24 PM

## 2022-03-26 NOTE — ED Notes (Signed)
[facility] called for transport to [facility]

## 2022-03-26 NOTE — ED Notes (Signed)
Patient belongings were found. Belongings are placed under desk in yellow.
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
## 2022-03-26 NOTE — Progress Notes (Signed)
Adult Psychoeducational Group Note  Date:  03/26/2022 Time:  8:24 PM  Group Topic/Focus:  Wrap-Up Group:   The focus of this group is to help patients review their daily goal of treatment and discuss progress on daily workbooks.  Participation Level:  Active  Participation Quality:  Appropriate  Affect:  Appropriate  Cognitive:  Appropriate  Insight: Appropriate  Engagement in Group:  Engaged  Modes of Intervention:  Discussion  Additional Comments:   Today was pt's first day on the unit, so he's trying to get a feel for the scheduling and medication management structure. Pt states he's had a good day but does endorse some feelings of anxiety. Pt looks forward to returning to work.  Roy Dudley 03/26/2022, 8:24 PM

## 2022-03-27 DIAGNOSIS — F22 Delusional disorders: Secondary | ICD-10-CM

## 2022-03-27 DIAGNOSIS — F1290 Cannabis use, unspecified, uncomplicated: Secondary | ICD-10-CM

## 2022-03-27 LAB — HEMOGLOBIN A1C
Hgb A1c MFr Bld: 5.7 % — ABNORMAL HIGH (ref 4.8–5.6)
Mean Plasma Glucose: 116.89 mg/dL

## 2022-03-27 LAB — LIPID PANEL
Cholesterol: 119 mg/dL (ref 0–200)
HDL: 34 mg/dL — ABNORMAL LOW (ref >40)
LDL Cholesterol: 77 mg/dL (ref 0–99)
Total CHOL/HDL Ratio: 3.5 RATIO
Triglycerides: 40 mg/dL (ref <150)
VLDL: 8 mg/dL (ref 0–40)

## 2022-03-27 MED ORDER — LORAZEPAM 2 MG/ML IJ SOLN: 2.0000 mg | Freq: Three times a day (TID) | INTRAMUSCULAR | Status: DC | PRN

## 2022-03-27 MED ORDER — NICOTINE 14 MG/24HR TD PT24
14.0000 mg | MEDICATED_PATCH | Freq: Every day | TRANSDERMAL | Status: DC
  Administered 2022-03-28: 14 mg via TRANSDERMAL
  Filled 2022-03-27 (×4): qty 1

## 2022-03-27 MED ORDER — HALOPERIDOL 5 MG PO TABS
5.0000 mg | ORAL_TABLET | Freq: Three times a day (TID) | ORAL | Status: DC | PRN
  Administered 2022-03-27 – 2022-04-03 (×3): 5 mg via ORAL
  Filled 2022-03-27 (×3): qty 1

## 2022-03-27 MED ORDER — DIPHENHYDRAMINE HCL 50 MG/ML IJ SOLN: 50.0000 mg | Freq: Three times a day (TID) | INTRAMUSCULAR | Status: DC | PRN

## 2022-03-27 MED ORDER — NICOTINE POLACRILEX 2 MG MT GUM
CHEWING_GUM | OROMUCOSAL | Status: AC
  Filled 2022-03-27: qty 1

## 2022-03-27 MED ORDER — HALOPERIDOL LACTATE 5 MG/ML IJ SOLN: 5.0000 mg | Freq: Three times a day (TID) | INTRAMUSCULAR | Status: DC | PRN

## 2022-03-27 MED ORDER — ESCITALOPRAM OXALATE 10 MG PO TABS
10.0000 mg | ORAL_TABLET | Freq: Every day | ORAL | Status: AC
  Administered 2022-03-27 – 2022-03-28 (×2): 10 mg via ORAL
  Filled 2022-03-27 (×3): qty 1

## 2022-03-27 MED ORDER — DIPHENHYDRAMINE HCL 25 MG PO CAPS
50.0000 mg | ORAL_CAPSULE | Freq: Three times a day (TID) | ORAL | Status: DC | PRN
  Administered 2022-04-01 – 2022-04-03 (×2): 50 mg via ORAL
  Filled 2022-03-27 (×2): qty 2

## 2022-03-27 MED ORDER — NICOTINE POLACRILEX 2 MG MT GUM
2.0000 mg | CHEWING_GUM | Freq: Four times a day (QID) | OROMUCOSAL | Status: DC | PRN
  Administered 2022-03-27 – 2022-04-04 (×19): 2 mg via ORAL
  Filled 2022-03-27 (×15): qty 1

## 2022-03-27 MED ORDER — HYDROXYZINE HCL 25 MG PO TABS: 25.0000 mg | ORAL_TABLET | Freq: Three times a day (TID) | ORAL | Status: DC | PRN

## 2022-03-27 MED ORDER — PRAZOSIN HCL 1 MG PO CAPS
1.0000 mg | ORAL_CAPSULE | Freq: Every day | ORAL | Status: DC
  Administered 2022-03-27 – 2022-04-03 (×8): 1 mg via ORAL
  Filled 2022-03-27 (×11): qty 1

## 2022-03-27 MED ORDER — ESCITALOPRAM OXALATE 20 MG PO TABS
20.0000 mg | ORAL_TABLET | Freq: Every day | ORAL | Status: DC
  Filled 2022-03-27: qty 1

## 2022-03-27 MED ORDER — LORAZEPAM 1 MG PO TABS
2.0000 mg | ORAL_TABLET | Freq: Three times a day (TID) | ORAL | Status: DC | PRN
  Administered 2022-04-01 – 2022-04-03 (×2): 2 mg via ORAL
  Filled 2022-03-27 (×2): qty 2

## 2022-03-27 MED ORDER — HYDROXYZINE HCL 25 MG PO TABS
25.0000 mg | ORAL_TABLET | Freq: Four times a day (QID) | ORAL | Status: DC | PRN
  Administered 2022-03-28 – 2022-04-03 (×9): 25 mg via ORAL
  Filled 2022-03-27 (×8): qty 1

## 2022-03-27 NOTE — Progress Notes (Signed)
   03/27/22 0800  Psych Admission Type (Psych Patients Only)  Admission Status Involuntary  Psychosocial Assessment  Patient Complaints Anxiety;Depression  Eye Contact Brief  Facial Expression Animated;Anxious  Affect Blunted;Depressed;Apprehensive;Irritable  Speech Logical/coherent  Interaction Assertive  Motor Activity Other (Comment) (WDL)  Appearance/Hygiene Unremarkable  Behavior Characteristics Cooperative;Appropriate to situation  Mood Anxious;Depressed  Thought Process  Coherency Circumstantial  Content WDL  Delusions None reported or observed  Perception Depersonalization  Hallucination Auditory  Judgment Impaired  Confusion WDL  Danger to Self  Current suicidal ideation? Denies  Self-Injurious Behavior No self-injurious ideation or behavior indicators observed or expressed   Agreement Not to Harm Self Yes  Description of Agreement Verbally Contracted for safety  Danger to Others  Danger to Others None reported or observed
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
## 2022-03-27 NOTE — Progress Notes (Signed)
Prn Po ativan 1 mg, and haldol 5 mg administered to patient at 1403 for anxiety and agitation. Patient tolerated medication well with no side effect noted. MD notified of patient condition. Patient is a lot less agitated and anxious at this time, and no acute distress noted.  Observation ongoing. Staff will continue to provide support and reassurance to patient.
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
## 2022-03-27 NOTE — BHH Suicide Risk Assessment (Signed)
[facility] Admission Suicide Risk Assessment   Nursing information obtained from:  Patient Demographic factors:  Male Current Mental Status:  NA Loss Factors:  Financial problems / change in socioeconomic status Historical Factors:  NA Risk Reduction Factors:  NA  Total Time spent with patient: 1.5 hours Principal Problem: MDD (major depressive disorder), recurrent, severe, with psychosis ([place]) Diagnosis:  Principal Problem:   MDD (major depressive disorder), recurrent, with psychotic features Active Problems:   PTSD (post-traumatic stress disorder)   Generalized anxiety disorder   Cannabis use disorder   Subjective Data: Roy Dudley is a 21 y.o., male with a medical history significant for Y-chromosome microdeletion, learning disability, scaphoencephaly and past psychiatric history significant for ADHD, PTSD, ODD who presents to the [facility] Involuntary from [facility] Emergency Department for evaluation and management of suicide attempt, and "mental breakdown."   Continued Clinical Symptoms:     CLINICAL FACTORS:   Alcohol/Substance Abuse/Dependencies More than one psychiatric diagnosis Unstable or Poor Therapeutic Relationship Previous Psychiatric Diagnoses and Treatments  Mental Status Exam: This patient interview was conducted in-person in the presence of the resident physician, attending physician, and medical student. I personally interviewed the patient with members of the team contributing their own questions, and by the end of the interview, established good rapport with the patient.   Basic Cognition: Roy Dudley is alert; he is oriented to person, place, time, and situation.   Appearance and Grooming: Black male with documented age of 21 y.o. who presents congruently to his documented sex. Patient is seated in a/n upright posture; he appears as documented age, and is casually dressed in t-shirt and joggers . Grooming appears overall clean: hair is clean  and fingernails appear clean. The patient has no noticeable scent or odor. There are no noticeable scars present present, and there is a visible tattoo on his right neck of a cross . There is no visible evidence of self harm (no cuts / ligature marks / cigarette burns, etc).   Behavior: The patient appears in no acute distress, and during the interview, was calm, distracted, required minimal redirection, and behaving appropriately to scenario; he was able to follow commands and compliant to requests and made good eye contact.   The patient did not appear internally or externally preoccupied.   Attitude: Patient's attitude towards the interviewer was cooperative and guarded, as evidenced by not mentioning relevant information about admission and suicide attempt .   Motor activity: The patient's movement speed was normal; his gait was normal. There was no notable abnormal facial movements and no notable abnormal extremity movements.   Speech: The patient's speech was clear, fluent, with good articulation, and with appropriately placed inflections. The volume of his speech was normal and normal in quantity. The rate was normal with a normal rhythm. Responses were normal in latency. There were no abnormal patterns in speech.   Mood: "I feel good"   Affect: Patient's affect is euthymic with broad range and even fluctuations; his affect is congruent with his stated mood. -------------------------------------------------------------------------------------------------------------------------   Thought Content The patient experiences auditory hallucinations, specifically, of whispers and commands to "kill himself," telling him he is "worthless" and "is nothing" which he attributes to demons and visual hallucinations, specifically, of demons without faces . The patient describes persecutory delusions, specifically of gang members being out to get him and his family ; he described no misperceptions of  stimuli (illusions). The patient denies feelings of derealization and denies feelings of depersonalization. The patient denies ideas  of reference; he denies thought insertion, denies thought withdrawal, denies thought interruption, and denies thought broadcasting.   Patient at the time of interview denies active suicidal intent and denies passive suicidal ideation; he denies homicidal intent.   Thought Process The patient's thought process is linear and is goal-directed.   Insight The patient at the time of interview demonstrates poor insight, as evidenced by lacking understanding of mental health condition/s, inability to acknowledge substance use disorder/s, inability to identify trigger/s causing mental health decompensation, and inability to identify adaptive and maladaptive coping strategies.   Judgement The patient over the past 24 hours demonstrates poor judgement, as evidenced by unwillingness to voluntarily seek help / treatment, suicide attempt, and not adhering to medication regimen.   Expanded Cognitive Exam: A more comprehensive cognitive exam is not indicated at this time  Sleep  Sleep:No data recorded  Physical Exam: Physical Exam Vitals reviewed.  HENT:     Head: Normocephalic and atraumatic.  Pulmonary:     Effort: Pulmonary effort is normal.  Skin:    General: Skin is warm and dry.  Neurological:     General: No focal deficit present.     Mental Status: He is alert. Mental status is at baseline.    Review of Systems  Constitutional:  Negative for chills.  Respiratory: Negative.    Cardiovascular: Negative.   Gastrointestinal: Negative.   Genitourinary: Negative.    Blood pressure 120/74, pulse (!) 54, temperature 98.8 F (37.1 C), temperature source Oral, resp. rate 17, height 6\' 1"  (1.854 m), weight 65.8 kg, SpO2 97 %. Body mass index is 19.13 kg/m.  COGNITIVE FEATURES THAT CONTRIBUTE TO RISK:  Thought constriction (tunnel vision)    SUICIDE RISK:   Acute Risk: Severe:  Frequent, intense, and enduring suicidal ideation, specific plan, no subjective intent, but some objective markers of intent (i.e., choice of lethal method), the method is accessible, some limited preparatory behavior, evidence of impaired self-control, severe dysphoria/symptomatology, multiple risk factors present, and few if any protective factors, particularly a lack of social support.  Chronic Risk:  Moderate:  Frequent suicidal ideation with limited intensity, and duration, some specificity in terms of plans, no associated intent, good self-control, limited dysphoria/symptomatology, some risk factors present, and identifiable protective factors, including available and accessible social support.  PLAN OF CARE: see H&P for full plan of care  I certify that inpatient services furnished can reasonably be expected to improve the patient's condition.   , MD 03/27/2022, 1:54 PM

## 2022-03-27 NOTE — Hospital Course (Addendum)
Roy Dudley is a 21 y.o., male with a medical history significant for Y-chromosome microdeletion, learning disability, scaphoencephaly and past psychiatric history significant for ADHD, PTSD, ODD who presents to the [facility] Involuntary from [facility] Emergency Department for evaluation and management of suicide attempt, and "mental breakdown."  #MDD with psychosis (r/o paranoid schizophrenia) -Started on decreased home dose of lexapro 10mg , was increased to 20mg  (home dose) for mood symptoms  -Started on zyprexa 5mg  per ED psych consult and this was continued on admission for treatment of psychosis and augmentation of antidepressant effect. Zyprexa was increased to 10mg  to address ongoing psychosis. He had increased suicidal ideation and hallucinations on 11/3, so he was placed on 1:1 and zyprexa 5mg  daily was added. He had subsequent improvement with addition of this morning dose.   -Less suspicious for PTSD given his hallucinations and delusions do not appear to be only related to prior traumatic event. Given concern for first episode psychosis with prior [facility] admission, CT head obtained in 2022 for psychosis, collateral report of increased seclusion since 2021, age, and ongoing delusions, first episode psychosis labs were ordered and were normal.  #PTSD Home prazosin was continued.   #GAD He was started on hydroxyzine 25mg  PRN for anxiety.  #Cannabis use disorder He uses cannabis regularly.   #Legal issues He attempted suicide via hand gun, denies it is suicide attempt but is not allowed access to guns given he is on probation. Cops are pressing charges on him, might go to jail. He verbalized to mom that he would hang himself if he were to go to jail, low threshold to obtain 1:1.   Discharge planned for 11/7.

## 2022-03-27 NOTE — Plan of Care (Signed)
  Problem: Coping: Goal: Coping ability will improve Outcome: Progressing Goal: Will verbalize feelings Outcome: Progressing   Problem: Safety: Goal: Ability to redirect hostility and anger into socially appropriate behaviors will improve Outcome: Progressing Goal: Ability to remain free from injury will improve Outcome: Progressing   

## 2022-03-27 NOTE — Group Note (Signed)
Recreation Therapy Group Note   Group Topic:Healthy Decision Making  Group Date: 03/27/2022 Start Time: 1000 End Time: 0867 Facilitators: Blaire Hodsdon-McCall, LRT,CTRS Location: [address] Day[address]   Goal Area(s) Addresses:  Patient will effectively work with peer towards shared goal.  Patient will identify factors that guided their decision making.  Patient will pro-socially communicate ideas during group session.    Group Description:  Patients were given a scenario that they were going to be stranded on a deserted [place] for several months before being rescued. Writer tasked them with making a list of 15 things they would choose to bring with them for "survival". The list of items was prioritized most important to least. Each patient would come up with their own list, then work together to create a new list of 15 items while in a group of 3-5 peers. LRT discussed each person's list and how it differed from others. The debrief included discussion of priorities, good decisions versus bad decisions, and how it is important to think before acting so we can make the best decision possible. LRT tied the concept of effective communication among group members to patient's support systems outside of the hospital and its benefit post discharge.   Affect/Mood: N/A   Participation Level: Did not attend    Clinical Observations/Individualized Feedback:     Plan: Continue to engage patient in RT group sessions 2-3x/week.   Dalicia Kisner-McCall, LRT,CTRS 03/27/2022 12:28 PM

## 2022-03-27 NOTE — Progress Notes (Signed)
   03/27/22 0700  Sleep  Number of Hours 6.5

## 2022-03-28 ENCOUNTER — Encounter (HOSPITAL_COMMUNITY): Payer: Self-pay

## 2022-03-28 ENCOUNTER — Encounter (HOSPITAL_COMMUNITY): Payer: Self-pay | Admitting: Psychiatry

## 2022-03-28 ENCOUNTER — Other Ambulatory Visit: Payer: Self-pay

## 2022-03-28 DIAGNOSIS — F22 Delusional disorders: Secondary | ICD-10-CM | POA: Diagnosis not present

## 2022-03-28 MED ORDER — OLANZAPINE 5 MG PO TBDP
ORAL_TABLET | ORAL | Status: AC
  Filled 2022-03-28: qty 1

## 2022-03-28 MED ORDER — OLANZAPINE 10 MG PO TABS
10.0000 mg | ORAL_TABLET | Freq: Every day | ORAL | Status: DC
  Administered 2022-03-28 – 2022-03-31 (×4): 10 mg via ORAL
  Filled 2022-03-28 (×5): qty 1

## 2022-03-28 MED ORDER — OLANZAPINE 5 MG PO TABS
5.0000 mg | ORAL_TABLET | Freq: Four times a day (QID) | ORAL | Status: DC | PRN
  Administered 2022-03-28 – 2022-03-30 (×2): 5 mg via ORAL
  Filled 2022-03-28 (×2): qty 1

## 2022-03-28 MED ORDER — ESCITALOPRAM OXALATE 10 MG PO TABS
10.0000 mg | ORAL_TABLET | Freq: Every day | ORAL | Status: DC
  Administered 2022-03-29: 10 mg via ORAL
  Filled 2022-03-28 (×3): qty 1

## 2022-03-28 MED ORDER — INFLUENZA VAC SPLIT QUAD 0.5 ML IM SUSY
0.5000 mL | PREFILLED_SYRINGE | INTRAMUSCULAR | Status: DC
  Filled 2022-03-28: qty 0.5

## 2022-03-28 MED ORDER — PNEUMOCOCCAL 20-VAL CONJ VACC 0.5 ML IM SUSY
0.5000 mL | PREFILLED_SYRINGE | INTRAMUSCULAR | Status: DC
  Filled 2022-03-28: qty 0.5

## 2022-03-28 NOTE — Group Note (Signed)
Recreation Therapy Group Note   Group Topic:Problem Solving  Group Date: 03/28/2022 Start Time: 1005 End Time: 1038 Facilitators: Suzi Hernan-McCall, LRT,CTRS Location: [address] Dayroom   Goal Area(s) Addresses:  Patient will effectively work with peer towards shared goal.  Patient will identify skills used to make activity successful.  Patient will share challenges and verbalize solution-driven approaches used. Patient will identify how skills used during activity can be used to reach post d/c goals.   Group Description: [employer]. Patients were provided the following materials: 5 drinking straws, 5 rubber bands, 5 paper clips, 2 index cards and 2 drinking cups.  Using the provided materials patients were asked to build a launching mechanism to launch a ping pong ball across the room, approximately 10 feet. Patients were divided into teams of 3-5. Instructions required all materials be incorporated into the device, functionality of items left to the peer group's discretion.   Affect/Mood: N/A   Participation Level: Did not attend    Clinical Observations/Individualized Feedback:     Plan: Continue to engage patient in RT group sessions 2-3x/week.   Iceis Knab-McCall, LRT,CTRS 03/28/2022 12:08 PM

## 2022-03-28 NOTE — Progress Notes (Signed)
Pt did not attend orientation group despite multiple prompts "I have to shower".
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
## 2022-03-28 NOTE — Progress Notes (Signed)
Recreation Therapy Notes  INPATIENT RECREATION THERAPY ASSESSMENT  Patient Details Name: Roy Dudley MRN: [id number] DOB: 10-May-2001 Today's Date: 03/28/2022       Information Obtained From: Patient  Able to Participate in Assessment/Interview: Yes  Patient Presentation: Alert  Reason for Admission (Per Patient): Other (Comments) ("spasing out")  Patient Stressors: Other (Comment) ("being here")  Coping Skills:   Isolation, Self-Injury, Journal, Music, Exercise, Deep Breathing, Meditate, Substance Abuse, Talk, Art, Prayer, Avoidance, Read, Hot Bath/Shower  Leisure Interests (2+):  Games - Video games, Individual - Other (Comment), Music - Other (Comment) ("handle other business"; "make music")  Frequency of Recreation/Participation: Other (Comment) (some weekly, some daily)  Awareness of Community Resources:  Yes  Community Resources:  Engineer, drilling, Tax inspector, Patent examiner  Current Use: No  If no, Barriers?: Other (Comment) ("paranoid")  Expressed Interest in [place]: No  County of Residence:  [place]  Patient Main Form of Transportation: Other (Comment) (mom)  Patient Strengths:  working out, religion, eating and video games  Patient Identified Areas of Improvement:  work on anger  Patient Goal for Hospitalization:  "I don't know"  Current SI (including self-harm):  No  Current HI:  No  Current AVH: Yes (Hearing spirits, leaves and air talking; Sees demons (black, white and blurry spots))  Staff Intervention Plan: Group Attendance, Collaborate with Interdisciplinary Treatment Team  Consent to Intern Participation: N/A   Roy Dudley, LRT,CTRS Roy Dudley 03/28/2022, 12:31 PM

## 2022-03-28 NOTE — Group Note (Signed)
   Date/Time: 11/1 @ 1pm  Type of Therapy and Topic:  Group Therapy:  triggers  Participation Level:   Active  Description of Group:   Recognizing Triggers: Patients defined triggers and discussed the importance of recognizing their personal warning signs. Patients identified their own triggers and how they tend to cope with stressful situations. Patients discussed areas such as people, places, things, and thoughts that rigger certain emotions for them. CSW provided support to patients and discussed safety planning for when these triggers occur. Group participants had opportunities to share openly with the group and participate in a group discussion while providing support and feedback to their peers.  Therapeutic Goals: Patient will identify triggers that are contributing to a problem in their life Patient will identify unwanted behaviors and feelings associated with a trigger.  Patient will share with other group members strategies to confront and avoid triggers so that they may be able to react appropriately to triggers in daily life.    Summary of Patient Progress: Patient participated appropriately in group . Patient discussed that being in the hospital was distressing and he tries to distract himself and assure that things will be taken care.  Patient was able to discuss triggers such as guns and people affecting his daily life.  Patient was able to come up with strategies to assist with his triggers.      Therapeutic Modalities:   Cognitive Behavioral Therapy Solution Focused Therapy Motivational Interviewing Family Systems Approach   Tayloranne Lekas Conrad, LCSW, [place] Social Worker  [facility]

## 2022-03-28 NOTE — BH IP Treatment Plan (Signed)
Interdisciplinary Treatment and Diagnostic Plan Update  03/28/2022 Time of Session: 1000 Roy Dudley MRN: [id number]  Principal Diagnosis: MDD (major depressive disorder), recurrent, severe, with psychosis (HCC)  Secondary Diagnoses: Principal Problem:   MDD (major depressive disorder), recurrent, with psychotic features Active Problems:   PTSD (post-traumatic stress disorder)   Generalized anxiety disorder   Cannabis use disorder   Current Medications:  Current Facility-Administered Medications  Medication Dose Route Frequency Provider Last Rate Last Admin   acetaminophen (TYLENOL) tablet 650 mg  650 mg Oral Q6H PRN Oneta Rack, NP   650 mg at 03/28/22 1406   alum & mag hydroxide-simeth (MAALOX/MYLANTA) 200-200-20 MG/5ML suspension 30 mL  30 mL Oral Q4H PRN Oneta Rack, NP       haloperidol (HALDOL) tablet 5 mg  5 mg Oral TID PRN Phineas Inches, MD   5 mg at 03/27/22 1403   And   LORazepam (ATIVAN) tablet 2 mg  2 mg Oral TID PRN Phineas Inches, MD       And   diphenhydrAMINE (BENADRYL) capsule 50 mg  50 mg Oral TID PRN Massengill, Harrold Donath, MD       haloperidol lactate (HALDOL) injection 5 mg  5 mg Intramuscular TID PRN Massengill, Harrold Donath, MD       And   LORazepam (ATIVAN) injection 2 mg  2 mg Intramuscular TID PRN Massengill, Harrold Donath, MD       And   diphenhydrAMINE (BENADRYL) injection 50 mg  50 mg Intramuscular TID PRN Phineas Inches, MD       Melene Muller ON 03/29/2022] escitalopram (LEXAPRO) tablet 10 mg  10 mg Oral Daily Karie Fetch, MD       hydrOXYzine (ATARAX) tablet 25 mg  25 mg Oral Q6H PRN Augusto Gamble, MD   25 mg at 03/28/22 1145   [START ON 03/29/2022] influenza vac split quadrivalent PF (FLUARIX) injection 0.5 mL  0.5 mL Intramuscular Tomorrow-1000 Attiah, Nadir, MD       magnesium hydroxide (MILK OF MAGNESIA) suspension 30 mL  30 mL Oral Daily PRN Oneta Rack, NP       nicotine polacrilex (NICORETTE) gum 2 mg  2 mg Oral Q6H PRN Augusto Gamble, MD   2 mg  at 03/28/22 1251   OLANZapine (ZYPREXA) tablet 10 mg  10 mg Oral QHS Karie Fetch, MD       OLANZapine [facility]) tablet 5 mg  5 mg Oral Q6H PRN Abbott Pao, Nadir, MD   5 mg at 03/28/22 1406   OLANZapine zydis (ZYPREXA) 5 MG disintegrating tablet            [START ON 03/29/2022] pneumococcal 20-valent conjugate vaccine (PREVNAR 20) injection 0.5 mL  0.5 mL Intramuscular Tomorrow-1000 Attiah, Nadir, MD       prazosin (MINIPRESS) capsule 1 mg  1 mg Oral QHS Augusto Gamble, MD   1 mg at 03/27/22 2031   traZODone (DESYREL) tablet 50 mg  50 mg Oral QHS PRN Oneta Rack, NP   50 mg at 03/26/22 2035   PTA Medications: Medications Prior to Admission  Medication Sig Dispense Refill Last Dose   escitalopram (LEXAPRO) 20 MG tablet Take 1 tablet (20 mg total) by mouth at bedtime. (Patient not taking: Reported on 03/22/2022) 30 tablet 0    hydrOXYzine (ATARAX/VISTARIL) 50 MG tablet Take 1 tablet (50 mg total) by mouth at bedtime. (Patient not taking: Reported on 01/04/2021) 30 tablet 0    prazosin (MINIPRESS) 1 MG capsule Take 3 capsules (3 mg total) by  mouth at bedtime. (Patient not taking: Reported on 01/04/2021) 30 capsule 0     Patient Stressors: Financial difficulties    Patient Strengths: Supportive family/friends  Work skills   Treatment Modalities: Medication Management, Group therapy, Case management,  1 to 1 session with clinician, Psychoeducation, Recreational therapy.   Physician Treatment Plan for Primary Diagnosis: MDD (major depressive disorder), recurrent, severe, with psychosis (HCC) Long Term Goal(s):     Short Term Goals: Ability to identify changes in lifestyle to reduce recurrence of condition will improve Ability to verbalize feelings will improve Ability to disclose and discuss suicidal ideas Ability to demonstrate self-control will improve Ability to identify and develop effective coping behaviors will improve  Medication Management: Evaluate patient's response, side effects,  and tolerance of medication regimen.  Therapeutic Interventions: 1 to 1 sessions, Unit Group sessions and Medication administration.  Evaluation of Outcomes: Progressing  Physician Treatment Plan for Secondary Diagnosis: Principal Problem:   MDD (major depressive disorder), recurrent, with psychotic features Active Problems:   PTSD (post-traumatic stress disorder)   Generalized anxiety disorder   Cannabis use disorder  Long Term Goal(s):     Short Term Goals: Ability to identify changes in lifestyle to reduce recurrence of condition will improve Ability to verbalize feelings will improve Ability to disclose and discuss suicidal ideas Ability to demonstrate self-control will improve Ability to identify and develop effective coping behaviors will improve     Medication Management: Evaluate patient's response, side effects, and tolerance of medication regimen.  Therapeutic Interventions: 1 to 1 sessions, Unit Group sessions and Medication administration.  Evaluation of Outcomes: Progressing   RN Treatment Plan for Primary Diagnosis: MDD (major depressive disorder), recurrent, severe, with psychosis (HCC) Long Term Goal(s): Knowledge of disease and therapeutic regimen to maintain health will improve  Short Term Goals: Ability to remain free from injury will improve, Ability to verbalize frustration and anger appropriately will improve, Ability to demonstrate self-control, Ability to participate in decision making will improve, Ability to verbalize feelings will improve, Ability to disclose and discuss suicidal ideas, Ability to identify and develop effective coping behaviors will improve, and Compliance with prescribed medications will improve  Medication Management: RN will administer medications as ordered by provider, will assess and evaluate patient's response and provide education to patient for prescribed medication. RN will report any adverse and/or side effects to prescribing  provider.  Therapeutic Interventions: 1 on 1 counseling sessions, Psychoeducation, Medication administration, Evaluate responses to treatment, Monitor vital signs and CBGs as ordered, Perform/monitor CIWA, COWS, AIMS and Fall Risk screenings as ordered, Perform wound care treatments as ordered.  Evaluation of Outcomes: Progressing   LCSW Treatment Plan for Primary Diagnosis: MDD (major depressive disorder), recurrent, severe, with psychosis (HCC) Long Term Goal(s): Safe transition to appropriate next level of care at discharge, Engage patient in therapeutic group addressing interpersonal concerns.  Short Term Goals: Engage patient in aftercare planning with referrals and resources, Increase social support, Increase ability to appropriately verbalize feelings, Increase emotional regulation, Facilitate acceptance of mental health diagnosis and concerns, Facilitate patient progression through stages of change regarding substance use diagnoses and concerns, Identify triggers associated with mental health/substance abuse issues, and Increase skills for wellness and recovery  Therapeutic Interventions: Assess for all discharge needs, 1 to 1 time with Social worker, Explore available resources and support systems, Assess for adequacy in community support network, Educate family and significant other(s) on suicide prevention, Complete Psychosocial Assessment, Interpersonal group therapy.  Evaluation of Outcomes: Progressing   Progress in Treatment:  Attending groups: Yes. Participating in groups: Yes. Taking medication as prescribed: Yes. Toleration medication: Yes. Family/Significant other contact made: Olu[place]iwa Kinyon (mother) [phone] Patient understands diagnosis: No. Discussing patient identified problems/goals with staff: Yes. Medical problems stabilized or resolved: Yes. Denies suicidal/homicidal ideation: Yes. Issues/concerns per patient self-inventory: Yes. Other: none  New problem(s)  identified: No, Describe:  none  New Short Term/Long Term Goal(s): Patient to work towards detox, elimination of symptoms of psychosis, medication management for mood stabilization; development of comprehensive mental wellness/sobriety plan.  Patient Goals:  Patient states their goal for treatment is to "go back to work and stack up money, and work on my behaviors (lashing out)."  Discharge Plan or Barriers: No psychosocial barriers identified at this time, patient to return to place of residence when appropriate for discharge.   Reason for Continuation of Hospitalization: Medication stabilization Other; describe psychosis   Estimated Length of Stay: 1-7 days   Scribe for Treatment Team: Larose Kells 03/28/2022 2:51 PM

## 2022-03-28 NOTE — Plan of Care (Signed)
  Problem: Activity: Goal: Will verbalize the importance of balancing activity with adequate rest periods Outcome: Progressing   Problem: Coping: Goal: Coping ability will improve Outcome: Progressing Goal: Will verbalize feelings Outcome: Progressing   

## 2022-03-28 NOTE — Progress Notes (Signed)
   03/28/22 0800  Psych Admission Type (Psych Patients Only)  Admission Status Involuntary  Psychosocial Assessment  Patient Complaints Anxiety;Depression  Eye Contact Brief  Facial Expression Animated;Anxious  Affect Depressed;Blunted;Preoccupied  Speech Logical/coherent  Interaction Assertive  Motor Activity Other (Comment) (WNL)  Appearance/Hygiene Unremarkable  Behavior Characteristics Cooperative;Appropriate to situation;Anxious  Mood Anxious;Euphoric;Preoccupied  Thought Process  Coherency Circumstantial  Content WDL  Delusions None reported or observed  Perception Depersonalization  Hallucination Auditory  Judgment Impaired  Confusion WDL  Danger to Self  Current suicidal ideation? Denies  Self-Injurious Behavior No self-injurious ideation or behavior indicators observed or expressed   Agreement Not to Harm Self Yes  Description of Agreement verbally contracts for safety  Danger to Others  Danger to Others None reported or observed

## 2022-03-29 ENCOUNTER — Encounter (HOSPITAL_COMMUNITY): Payer: Self-pay | Admitting: Psychiatry

## 2022-03-29 LAB — HIV ANTIBODY (ROUTINE TESTING W REFLEX): HIV Screen 4th Generation wRfx: NONREACTIVE

## 2022-03-29 LAB — VITAMIN B12: Vitamin B-12: 214 pg/mL (ref 180–914)

## 2022-03-29 LAB — PROLACTIN: Prolactin: 14.9 ng/mL (ref 4.0–15.2)

## 2022-03-29 LAB — FOLATE: Folate: 5.9 ng/mL — ABNORMAL LOW (ref >5.9)

## 2022-03-29 MED ORDER — ESCITALOPRAM OXALATE 20 MG PO TABS
20.0000 mg | ORAL_TABLET | Freq: Every day | ORAL | Status: DC
  Administered 2022-03-30 – 2022-04-04 (×6): 20 mg via ORAL
  Filled 2022-03-29 (×8): qty 1

## 2022-03-29 NOTE — Progress Notes (Signed)
D- Patient alert and oriented.  Denies SI, HI. Patient endorses [facility] stating that he sees "triangle demons" that sometimes tell him "kill yourself" or "shut up." Patient reported pain at a 7/10 in his right hip. Patient rated his anxiety as a 7/10 and depression as a 4/10.  A- Scheduled medications administered to patient, per MAR. PRN tylenol administered for pain. Support and encouragement provided.  Routine safety checks conducted every 15 minutes.  Patient informed to notify staff with problems or concerns.  R- No adverse drug reactions noted. Patient verbally contracts for safety at this time. Patient compliant with medications and treatment plan. Patient receptive, calm, and cooperative. Patient interacts well with others on the unit.  Patient remains safe at this time.

## 2022-03-29 NOTE — Progress Notes (Signed)
Adult Psychoeducational Group Note  Date:  03/29/2022 Time:  8:30 PM  Group Topic/Focus:  Wrap-Up Group:   The focus of this group is to help patients review their daily goal of treatment and discuss progress on daily workbooks.  Participation Level:  Active  Participation Quality:  Appropriate  Affect:  Appropriate  Cognitive:  Appropriate  Insight: Appropriate  Engagement in Group:  Engaged  Modes of Intervention:  Discussion and Support  Additional Comments:   Pt attended and participated in the [place] group. Pt rated his day 7/10.  Pt has made some progress towards his goal of improved mental health. Pt indicated that he uses a lot of different coping skills to help him  cope daily. Pt reports using the following coping skills consistently to improve his mental wellness and daily coping; medication compliance sleep,reading, reflection and practicing gratitude.  Wetzel Bjornstad Brayley Mackowiak 03/29/2022, 8:30 PM

## 2022-03-29 NOTE — Progress Notes (Signed)
[facility] MD Resident Progress Note  03/29/2022 3:43 PM Jerol Rufener  MRN:  [id number] Principal Problem: MDD (major depressive disorder), recurrent, severe, with psychosis (HCC) Diagnosis: Principal Problem:   MDD (major depressive disorder), recurrent, with psychotic features Active Problems:   PTSD (post-traumatic stress disorder)   Generalized anxiety disorder   Cannabis use disorder  Reason for admission   Capone Ditton is a 21 y.o., male with a medical history significant for Y-chromosome microdeletion, learning disability, scaphoencephaly and past psychiatric history significant for ADHD, PTSD, ODD who presents to the [facility] Involuntary from [facility] Emergency Department for evaluation and management of suicide attempt, and "mental breakdown." (admitted on 03/26/2022, total  LOS: 3 days )  Chart review from last 24 hours   The patient's chart was reviewed and nursing notes were reviewed. The patient's case was discussed in multidisciplinary team meeting.  - Overnight events per chart review: Continued AVH. Anxiety 7/10. Depression 4/10. R hip pain 7/10. Participated in 1 group therapy session.  - Patient received all scheduled meds  - Patient received PRN zyprexa (1406), hydroxyzine (1145), tylenol (2129) -Received total of 15mg  zyprexa yesterday    Information obtained during interview   The patient was seen and evaluated on the unit. On assessment today the patient reports trouble falling asleep but ability to stay asleep. He reports he ate dinner and breakfast this AM. His last BM was yesterday. He reports ongoing nightmares of people passing away, sometimes family members. He denies medication side effects. He reports feeling "fair" because he is "still alive." He reports that if he goes to jail he will hang himself because there are rapists in jail. He initially denies events leading up to hospitalization this AM. Later during the day he does endorse it was a suicide  attempt and one of the triggers was because it was close to his dad's birthday. He continues to report seeing spirits and ghosts though notes that these have lessened since yesterday. Reports there are evil (triangle ones) that tell him to kill himself and good (white ones) that tell them to shut up. He reports seeing these since he was 21 yo. He reports hearing the rhythm in songs talking directly to him.   Review of Systems  Respiratory:  Negative for shortness of breath.   Cardiovascular:  Negative for chest pain.  Gastrointestinal:  Negative for abdominal pain, constipation, diarrhea, nausea and vomiting.  Neurological:  Negative for headaches.   Objective   Blood pressure 131/84, pulse (!) 48, temperature 97.7 F (36.5 C), temperature source Oral, resp. rate 18, height 6\' 1"  (1.854 m), weight 65.8 kg, SpO2 100 %. Body mass index is 19.13 kg/m. Sleep: 7.75 hours   Current Facility-Administered Medications:    acetaminophen (TYLENOL) tablet 650 mg, 650 mg, Oral, Q6H PRN, 14, NP, 650 mg at 03/28/22 2129   alum & mag hydroxide-simeth (MAALOX/MYLANTA) 200-200-20 MG/5ML suspension 30 mL, 30 mL, Oral, Q4H PRN, 2130, NP   haloperidol (HALDOL) tablet 5 mg, 5 mg, Oral, TID PRN, 5 mg at 03/27/22 1403 **AND** LORazepam (ATIVAN) tablet 2 mg, 2 mg, Oral, TID PRN **AND** diphenhydrAMINE (BENADRYL) capsule 50 mg, 50 mg, Oral, TID PRN, Massengill, Oneta Rack, MD   haloperidol lactate (HALDOL) injection 5 mg, 5 mg, Intramuscular, TID PRN **AND** LORazepam (ATIVAN) injection 2 mg, 2 mg, Intramuscular, TID PRN **AND** diphenhydrAMINE (BENADRYL) injection 50 mg, 50 mg, Intramuscular, TID PRN, Massengill, 03/29/22, MD   [START ON 03/30/2022] escitalopram (LEXAPRO) tablet 20 mg,  20 mg, Oral, Daily, Karie Fetch, MD   hydrOXYzine (ATARAX) tablet 25 mg, 25 mg, Oral, Q6H PRN, Augusto Gamble, MD, 25 mg at 03/28/22 1145   influenza vac split quadrivalent PF (FLUARIX) injection 0.5 mL, 0.5 mL,  Intramuscular, Tomorrow-1000, Attiah, Nadir, MD   magnesium hydroxide (MILK OF MAGNESIA) suspension 30 mL, 30 mL, Oral, Daily PRN, Oneta Rack, NP   nicotine polacrilex (NICORETTE) gum 2 mg, 2 mg, Oral, Q6H PRN, Augusto Gamble, MD, 2 mg at 03/28/22 1251   OLANZapine (ZYPREXA) tablet 10 mg, 10 mg, Oral, QHS, Karie Fetch, MD, 10 mg at 03/28/22 2129   OLANZapine (ZYPREXA) tablet 5 mg, 5 mg, Oral, Q6H PRN, Abbott Pao, Nadir, MD, 5 mg at 03/28/22 1406   pneumococcal 20-valent conjugate vaccine (PREVNAR 20) injection 0.5 mL, 0.5 mL, Intramuscular, Tomorrow-1000, Attiah, Nadir, MD   prazosin (MINIPRESS) capsule 1 mg, 1 mg, Oral, QHS, Augusto Gamble, MD, 1 mg at 03/28/22 2129   traZODone (DESYREL) tablet 50 mg, 50 mg, Oral, QHS PRN, Oneta Rack, NP, 50 mg at 03/26/22 2035  Physical Exam  Constitutional:      Appearance: the patient is not toxic-appearing.  Pulmonary:     Effort: Pulmonary effort is normal.  Neurological:     General: No focal deficit present.     Mental Status: the patient is alert and oriented to person, place, and time.  AIMS:  Facial and Oral Movements Muscles of Facial Expression: None, normal Lips and Perioral Area: None, normal Jaw: None, normal Tongue: None, normal,Extremity Movements Upper (arms, wrists, hands, fingers): None, normal Lower (legs, knees, ankles, toes): None, normal Trunk Movements: None, normal  Neck, shoulders, hips: None, normal Overall Severity Severity of abnormal movements (highest score from questions above): None, normal Incapacitation due to abnormal movements: None, normal Patient's awareness of abnormal movements (rate only patient's report): No Awareness, Dental Status Current problems with teeth and/or dentures?: No Does patient usually wear dentures?: No   No stiffness, cogwheeling, or tremors noted on exam.   Mental Status Exam   Appearance and Grooming: Patient is casually dressed in white Tshirt and jeans . The patient has no  noticeable scent or odor. Motor activity: The patient's movement speed was normal; his gait was normal. There was no notable abnormal facial movements and no notable abnormal extremity movements. Behavior: The patient appears in no acute distress, and during the interview, was calm, focused, required minimal redirection, and behaving appropriately to scenario; he was able to follow commands and compliant to requests and made good eye contact. The patient did not appear internally or externally preoccupied. Attitude: Patient's attitude towards the interviewer was cooperative and open. Speech: The patient's speech was clear, fluent, and with good articulation. The volume of his speech was normal and normal in quantity. The rate was normal with a normal rhythm. Responses were normal in latency. There were no abnormal patterns in speech. Mood: "Fair" Affect: Patient's affect is euthymic and anxious with broad range and even fluctuations; his affect is congruent with his stated mood. ------------------------------------------------------------------------------------------------------------------------- Thought Content The patient experiences auditory hallucinations, specifically, hearing the demons talk and visual hallucinations, specifically, seeing demons . The patient describes delusions of grandeur, specifically that he is number one ; he denies thought insertion, denies thought withdrawal, denies thought interruption, and endorses thought broadcasting, specifically that people are using his thoughts to carry out his plans . Patient at the time of interview denies active suicidal intent and denies passive suicidal ideation; he denies homicidal intent. Thought  Process The patient's thought process is linear and is goal-directed. Insight The patient at the time of interview demonstrates poor insight, as evidenced by lacking understanding of mental health condition/s and inability to identify  adaptive and maladaptive coping strategies. Judgement The patient over the past 24 hours demonstrates fair judgement, as evidenced by passively participating in group therapy and engaging appropriately with staff / other patients.  Assets: Armed forces logistics/support/administrative officer; Financial Resources/Insurance; Social Support  Treatment Plan Summary: Daily contact with patient to assess and evaluate symptoms and progress in treatment and Medication management  Summary   Cullan Ostrow is a 21 y.o., male with a medical history significant for Y-chromosome microdeletion, learning disability, scaphoencephaly and past psychiatric history significant for ADHD, PTSD, ODD who presents to the [facility] Involuntary from [facility] Emergency Department for evaluation and management of suicide attempt, and "mental breakdown", current diagnosis is MDD with psychotic features given his hx of MDD prior to admission. However, concern for paranoid schizophrenia given patient's family history and ongoing delusions and hallucinations. Will continue to see if patient's psychosis resolves as his mood improves.    Diagnoses / Active Problems: MDD (major depressive disorder), recurrent, severe, with psychosis ([place]) Principal Problem:   MDD (major depressive disorder), recurrent, with psychotic features Active Problems:   PTSD (post-traumatic stress disorder)   Generalized anxiety disorder   Cannabis use disorder   Tobacco use disorder  Plan  Safety and Monitoring: -- Involuntary admission to inpatient psychiatric unit for safety, stabilization and treatment -- Daily contact with patient to assess and evaluate symptoms and progress in treatment -- Patient's case to be discussed in multi-disciplinary team meeting -- Observation Level : q15 minute checks -- Vital signs:  q12 hours -- Precautions: suicide, elopement, and assault  2. Medications:  #MDD with psychosis (r/o paranoid schizophrenia) Given ongoing  psychosis including hallucinations, thought broadcasting, and delusions as well as noted adverse effects with increased nausea and decreased appetite, will continue current dose of lexapro and increase zyprexa. Less suspicious for PTSD given his hallucinations and delusions do not appear to be only related to prior traumatic event. He denies side effects with decreased appetite or nausea today. Given concern for first episode psychosis with prior [facility] admission, CT head obtained in 2022 for psychosis, collateral report of increased seclusion since 2021, age, and ongoing delusions, will also obtain first episode psychosis labs.  -CONTINUE 10mg  lexapro, plan to increase to 20mg  tomorrow  -CONTINUE zyprexa 10mg  QHS, plan to increase to 15mg  QHS tomorrow   -CONTINUE PRN maalox for indigestion  -f/u folate, B12, RPR, HIV   #PTSD Home prazosin was continued.    #GAD He was started on hydroxyzine 25mg  PRN for anxiety.   #Cannabis use disorder He uses cannabis regularly.    #Legal issues He attempted suicide via hand gun, denies it is suicide attempt but is not allowed access to guns given he is on probation. Cops are pressing charges on him, might go to jail. He verbalized to mom that he would hang himself if he were to go to jail, low threshold to obtain 1:1. Per social work, he will go to jail on discharge.  -would obtain 1:1 to endorses ANY SI/HI   #Tobacco use  -- Nicotine polacrilex (gum) ordered, Nicotine patch 14mg /24 hours ordered, and Smoking cessation encouraged   Agitation protocol: as needed oral haloperidol 5 mg, and lorazepam 2 mg, and diphenhydramine 50 mg with backup IM of similar meds  The risks/benefits/side-effects/alternatives to the above medication  were discussed in detail with the patient and time was given for questions. The patient consents to medication trial. FDA black box warnings, if present, were discussed.  The patient is agreeable with the medication plan, as  above. We will monitor the patient's response to pharmacologic treatment, and adjust medications as necessary.  3. Routine and other pertinent labs: EKG monitoring: QTc: 361 (10/28) Lab Results:     Latest Ref Rng & Units 03/23/2022    8:15 PM 03/21/2022    8:09 PM 01/03/2021    8:28 PM  CBC  WBC 4.0 - 10.5 K/uL 6.7  8.0  4.7   Hemoglobin 13.0 - 17.0 g/dL 82.9  93.7  16.9   Hematocrit 39.0 - 52.0 % 42.5  42.4  42.2   Platelets 150 - 400 K/uL 257  228  259       Latest Ref Rng & Units 03/23/2022    8:15 PM 03/21/2022    8:09 PM 01/03/2021    8:28 PM  BMP  Glucose 70 - 99 mg/dL 86  79  678   BUN 6 - 20 mg/dL 10  13  11    Creatinine 0.61 - 1.24 mg/dL  9.38  1.01   Sodium 135 - 145 mmol/L 143  141  138   Potassium 3.5 - 5.1 mmol/L 3.5  3.7  4.2   Chloride 98 - 111 mmol/L 107  107  103   CO2 22 - 32 mmol/L 29  23  27    Calcium 8.9 - 10.3 mg/dL 9.6  9.6  9.6    Blood Alcohol level:  Lab Results  Component Value Date   ETH <10 03/23/2022   ETH <10 03/21/2022   Prolactin: Lab Results  Component Value Date   PROLACTIN 14.9 03/27/2022   PROLACTIN 4.2 07/18/2019   Lipid Panel: Lab Results  Component Value Date   CHOL 119 03/27/2022   TRIG 40 03/27/2022   HDL 34 (L) 03/27/2022   CHOLHDL 3.5 03/27/2022   VLDL 8 03/27/2022   LDLCALC 77 03/27/2022   LDLCALC 95 10/14/2019   HbgA1c: Lab Results  Component Value Date   HGBA1C 5.7 (H) 03/27/2022   TSH: Lab Results  Component Value Date   TSH 0.547 10/14/2019    4. Group Therapy: -- Encouraged patient to participate in unit milieu and in scheduled group therapies  -- Short Term Goals: Ability to identify changes in lifestyle to reduce recurrence of condition will improve, Ability to verbalize feelings will improve, Ability to disclose and discuss suicidal ideas, Ability to identify and develop effective coping behaviors will improve, Ability to maintain clinical measurements within normal limits will improve, and  Ability to identify triggers associated with substance abuse/mental health issues will improve -- Long Term Goals: Improvement in symptoms so as ready for discharge -- Patient is encouraged to participate in group therapy while admitted to the psychiatric unit. -- We will address other chronic and acute stressors, which contributed to the patient's MDD (major depressive disorder), recurrent, severe, with psychosis (HCC) in order to reduce the risk of self-harm at discharge.  5. Discharge Planning:  -- Social work and case management to assist with discharge planning and identification of hospital follow-up needs prior to discharge -- Estimated LOS: 5-7 days -- Discharge Concerns: Need to establish a safety plan; Medication compliance and effectiveness -- Discharge Goals: Return home with outpatient referrals for mental health follow-up including medication management/psychotherapy  I certify that inpatient services furnished can reasonably be expected to  improve the patient's condition.   I discussed my assessment, planned testing and intervention for the patient with Dr. Abbott PaoAttiah who agrees with my formulated course of action.  Karie FetchStephanie Marquay Kruse, MD, PGY-1 03/29/2022, 3:43 PM

## 2022-03-29 NOTE — Plan of Care (Signed)
  Problem: Education: Goal: Will be free of psychotic symptoms Outcome: Not Progressing   Problem: Coping: Goal: Coping ability will improve Outcome: Progressing Goal: Will verbalize feelings Outcome: Progressing   Problem: Health Behavior/Discharge Planning: Goal: Compliance with prescribed medication regimen will improve Outcome: Progressing   Problem: Role Relationship: Goal: Ability to communicate needs accurately will improve Outcome: Progressing Goal: Ability to interact with others will improve Outcome: Progressing   Problem: Self-Concept: Goal: Will verbalize positive feelings about self Outcome: Progressing

## 2022-03-29 NOTE — Progress Notes (Signed)
   03/29/22 1100  Psych Admission Type (Psych Patients Only)  Admission Status Involuntary  Psychosocial Assessment  Patient Complaints Depression  Eye Contact Fair  Facial Expression Anxious  Affect Blunted  Speech Logical/coherent  Interaction Assertive  Motor Activity Other (Comment)  Appearance/Hygiene Unremarkable  Behavior Characteristics Cooperative  Mood Anxious  Thought Process  Coherency Circumstantial  Content WDL  Delusions None reported or observed  Perception Hallucinations  Hallucination Visual  Judgment Impaired  Confusion None  Danger to Self  Current suicidal ideation? Denies  Danger to Others  Danger to Others None reported or observed
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
## 2022-03-29 NOTE — Progress Notes (Signed)
   03/29/22 0535  Sleep  Number of Hours 7.75

## 2022-03-29 NOTE — Group Note (Signed)
Recreation Therapy Group Note   Group Topic:Coping Skills  Group Date: 03/29/2022 Start Time: 1000 End Time: 1027 Facilitators: Iola Turri-McCall, LRT,CTRS Location: [address] Dayroom   Goal Area(s) Addresses: Patient will define what a coping skill is. Patient will successfully identify positive coping skills they can use post d/c.  Patient will acknowledge benefit(s) of using learned coping skills post d/c.  Group Description:  Coping A to Z. Patient asked to identify what a coping skill is and when they use them. Patients with Probation officer discussed healthy versus unhealthy coping skills. Next patients were given a blank worksheet titled "Coping Skills A-Z". Patients were instructed to come up with at least one positive coping skill per letter of the alphabet, addressing a specific challenge (ex: stress, anger, anxiety, depression, grief, doubt, isolation, self-harm/suicidal thoughts, substance use). Patients were given 15 minutes to brainstorm before ideas were presented to the large group. Patients and LRT debriefed on the importance of coping skill selection based on situation and back-up plans when a skill tried is not effective. At the end of group, patients were given an handout of alphabetized strategies to keep for future reference.   Affect/Mood: N/A   Participation Level: Did not attend    Clinical Observations/Individualized Feedback:     Plan: Continue to engage patient in RT group sessions 2-3x/week.   Kabao Leite-McCall, LRT,CTRS  03/29/2022 11:50 AM
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
## 2022-03-30 LAB — RPR: RPR Ser Ql: NONREACTIVE

## 2022-03-30 MED ORDER — OLANZAPINE 5 MG PO TABS
5.0000 mg | ORAL_TABLET | Freq: Every day | ORAL | Status: DC
  Administered 2022-03-30 – 2022-04-04 (×6): 5 mg via ORAL
  Filled 2022-03-30 (×7): qty 1

## 2022-03-30 MED ORDER — TRAZODONE HCL 50 MG PO TABS
50.0000 mg | ORAL_TABLET | Freq: Once | ORAL | Status: AC
  Administered 2022-03-30: 50 mg via ORAL
  Filled 2022-03-30 (×2): qty 1

## 2022-03-30 NOTE — Progress Notes (Signed)
[facility] MD Resident Progress Note  03/30/2022 11:48 AM Roy Dudley  MRN:  [id number] Principal Problem: MDD (major depressive disorder), recurrent, severe, with psychosis ([place]) Diagnosis: Principal Problem:   MDD (major depressive disorder), recurrent, with psychotic features Active Problems:   PTSD (post-traumatic stress disorder)   Generalized anxiety disorder   Cannabis use disorder  Reason for admission   Roy Dudley is a 21 y.o., male with a medical history significant for Y-chromosome microdeletion, learning disability, scaphoencephaly and past psychiatric history significant for ADHD, PTSD, ODD who presents to the [facility] Involuntary from [facility] Emergency Department for evaluation and management of suicide attempt, and "mental breakdown."  (admitted on 03/26/2022, total  LOS: 4 days )  Chart review from last 24 hours   The patient's chart was reviewed and nursing notes were reviewed. The patient's case was discussed in multidisciplinary team meeting.  - Overnight events per chart review: Continued VH. Pt did not attend rec therapy but was engaged in psychoeducational group. Mom was contacted and visited with the patient in the evening. - Patient received all scheduled meds  - Patient received PRN Nicorette gum (1834), Trazodone 50 mg (2053)  Information obtained during interview   The patient was seen and evaluated on the unit. On assessment today the patient reports SI with no plan. He says that he did not notify anyone of this. The patient also confirm some HI for "those that hurt" him. He also reported AH of spirit voices in "another language" that have continued and are louder this morning. He also reported continued VH of triangle demons. The patient endorsed having nightmares last night where he was "escaping from a matrix". Patient endorses good sleep (6.5 hours); endorses good appetite. He confirmed that he met with his mom in the evening, that the encounter  went well, and that they talked about "home".   Review of Systems  Respiratory:  Negative for shortness of breath.   Cardiovascular:  Negative for chest pain.  Gastrointestinal: Negative for abdominal pain, constipation, diarrhea, nausea and vomiting.  Neurological: Negative for headaches.   Objective   Blood pressure 129/60, pulse 89, temperature 98.2 F (36.8 C), temperature source Oral, resp. rate 18, height 6' 1" (1.854 m), weight 65.8 kg, SpO2 100 %. Body mass index is 19.13 kg/m. Sleep: 6.5 hours Current Medications: Current Facility-Administered Medications  Medication Dose Route Frequency Provider Last Rate Last Admin   acetaminophen (TYLENOL) tablet 650 mg  650 mg Oral Q6H PRN Derrill Center, NP   650 mg at 03/28/22 2129   alum & mag hydroxide-simeth (MAALOX/MYLANTA) 200-200-20 MG/5ML suspension 30 mL  30 mL Oral Q4H PRN Derrill Center, NP       haloperidol (HALDOL) tablet 5 mg  5 mg Oral TID PRN Janine Limbo, MD   5 mg at 03/27/22 1403   And   LORazepam (ATIVAN) tablet 2 mg  2 mg Oral TID PRN Janine Limbo, MD       And   diphenhydrAMINE (BENADRYL) capsule 50 mg  50 mg Oral TID PRN Massengill, Ovid Curd, MD       haloperidol lactate (HALDOL) injection 5 mg  5 mg Intramuscular TID PRN Massengill, Ovid Curd, MD       And   LORazepam (ATIVAN) injection 2 mg  2 mg Intramuscular TID PRN Massengill, Ovid Curd, MD       And   diphenhydrAMINE (BENADRYL) injection 50 mg  50 mg Intramuscular TID PRN Janine Limbo, MD  escitalopram (LEXAPRO) tablet 20 mg  20 mg Oral Daily Rolanda Lundborg, MD   20 mg at 03/30/22 8250   hydrOXYzine (ATARAX) tablet 25 mg  25 mg Oral Q6H PRN Camelia Phenes, MD   25 mg at 03/28/22 1145   influenza vac split quadrivalent PF (FLUARIX) injection 0.5 mL  0.5 mL Intramuscular Tomorrow-1000 Attiah, Nadir, MD       magnesium hydroxide (MILK OF MAGNESIA) suspension 30 mL  30 mL Oral Daily PRN Derrill Center, NP       nicotine polacrilex (NICORETTE)  gum 2 mg  2 mg Oral Q6H PRN Camelia Phenes, MD   2 mg at 03/29/22 1834   OLANZapine (ZYPREXA) tablet 10 mg  10 mg Oral QHS Rolanda Lundborg, MD   10 mg at 03/29/22 2053   OLANZapine (ZYPREXA) tablet 5 mg  5 mg Oral Q6H PRN Winfred Leeds, Nadir, MD   5 mg at 03/28/22 1406   OLANZapine (ZYPREXA) tablet 5 mg  5 mg Oral Daily Attiah, Nadir, MD   5 mg at 03/30/22 5397   pneumococcal 20-valent conjugate vaccine (PREVNAR 20) injection 0.5 mL  0.5 mL Intramuscular Tomorrow-1000 Attiah, Nadir, MD       prazosin (MINIPRESS) capsule 1 mg  1 mg Oral QHS Camelia Phenes, MD   1 mg at 03/29/22 2053   traZODone (DESYREL) tablet 50 mg  50 mg Oral QHS PRN Derrill Center, NP   50 mg at 03/29/22 2053   Labs: Folate 5.9, RPR non reactive, Vitamin B12 214, Prolactin 14.9, pending GC/Chlamydia  Physical Exam Constitutional:      Appearance: the patient is not toxic-appearing.  Pulmonary:     Effort: Pulmonary effort is normal.  Neurological:     General: No focal deficit present.     Mental Status: the patient is alert and oriented to person, place, and year. Was not oriented to month (reported October), city (reported [place]), or day of the week (reported Wednesday).  AIMS: Facial and Oral Movements Muscles of Facial Expression: None, normal Lips and Perioral Area: None, normal Jaw: None, normal Tongue: None, normal,Extremity Movements Upper (arms, wrists, hands, fingers): - Lower (legs, knees, ankles, toes): - Trunk Movements: - Neck, shoulders, hips: - Overall Severity Severity of abnormal movements (highest score from questions above): None, normal Incapacitation due to abnormal movements: None, normal Patient's awareness of abnormal movements (rate only patient's report): No Awareness, Dental Status Current problems with teeth and/or dentures?: No Does patient usually wear dentures?: No    Pt did not want [place] to physically examine him today.  Mental Status Exam   Appearance and Grooming: Patient is  fully  under his sheets (minus face) . The patient has no noticeable scent or odor. Motor activity: The patient's movement speed and gait were not observed as pt was in bed. There was no notable abnormal facial movements and no notable abnormal extremity movements. Behavior: The patient appears in no acute distress, and during the interview, was calm, focused, required minimal redirection, and behaving appropriately to scenario; he was able to follow commands and compliant to requests and made good eye contact. The patient did not appear internally or externally preoccupied. Attitude: Patient's attitude towards the interviewer was uncooperative, as evidenced by not wanting to be physically examined for rigidity and guarded, as evidenced by unwillingness to share details with interviewers . Speech: The patient's speech was clear, fluent, with good articulation, with appropriately placed inflections, and with inappropriately placed inflections. The volume of his speech was  soft and normal in quantity. The rate was normal with a normal rhythm. Responses were normal in latency. There were no abnormal patterns in speech. Mood: "fine" Affect: Patient's affect is flat with broad range and even fluctuations; his affect is congruent with his stated mood. ---------------------------------------------------------------------------------------------------------------------- Thought Content The patient experiences auditory hallucinations, specifically, voices of spirits in another language and visual hallucinations, specifically, spirits (triangle demons) . The patient describes no delusional thoughts; he denies thought insertion, denies thought withdrawal, denies thought interruption, and denies thought broadcasting. Patient at the time of interview endorses active suicidal intent without plan; he endorses homicidal intent towards those who hurt him without plan and without means and without access. Thought  Process The patient's thought process is linear and is goal-directed. Insight The patient at the time of interview demonstrates poor insight, as evidenced by inability to identify trigger/s causing mental health decompensation and inability to identify adaptive and maladaptive coping strategies. Judgement The patient over the past 24 hours demonstrates poor judgement, as evidenced by unwillingness to voluntarily seek help / treatment and tell others about his suicidal ideation.  Assets: Armed forces logistics/support/administrative officer; Financial Resources/Insurance; Social Support  Treatment Plan Summary: Daily contact with patient to assess and evaluate symptoms and progress in treatment and Medication management. Collateral from mom.  Summary   Tyr Wallen is a 75 y.o., male with a medical history significant for Y-chromosome microdeletion, learning disability, scaphoencephaly and past psychiatric history significant for ADHD, PTSD, ODD who presents to the [facility] Involuntary from [facility] Emergency Department for evaluation and management of suicide attempt, and "mental breakdown", current diagnosis is MDD with psychotic features given his hx of MDD prior to admission. However, concern for paranoid schizophrenia given patient's family history and ongoing delusions and hallucinations. Will continue to see if patient's psychosis and recent reports of active SI improve.  Diagnoses / Active Problems: MDD (major depressive disorder), recurrent, severe, with psychosis ([place]) Principal Problem:   MDD (major depressive disorder), recurrent, with psychotic features Active Problems:   PTSD (post-traumatic stress disorder)   Generalized anxiety disorder   Cannabis use disorder  Plan  Safety and Monitoring: -- Involuntary admission to inpatient psychiatric unit for safety, stabilization and treatment -- Daily contact with patient to assess and evaluate symptoms and progress in treatment -- Patient's case  to be discussed in multi-disciplinary team meeting -- Observation Level : 1:1 sitter given recent SI & not contracting for safety -- Vital signs: q12 hours -- Precautions: suicide, elopement, and assault  2. Medications:  #MDD with psychosis (r/o paranoid schizophrenia) Pt has ongoing psychosis with reported increase in auditory and visual hallucinations this AM. He continued to have a low mood. Will plan to increase Lexapro and Zyprexa. This morning pt has active SI with no plan and continued HI with no plan. Continuing to be less suspicious for PTSD given his hallucinations and delusions do not appear to be only related to prior traumatic event. He denies side effects with decreased appetite or nausea today. Given concern for first episode psychosis with prior [facility] admission, CT head obtained in 2022 for psychosis, collateral report of increased seclusion since 2021, age, and ongoing delusions, first episode psychosis labs were ordered and were normal thus far. Given patient's active SI, his denial and unwillingness to share about his suicide attempt prior to admission as well as his continued report of SI and plan after discharge, and the fact that the patient did not contract for safety by telling staff about his  suicidal ideation this AM, patient was placed on 1:1 watch. -CONTINUE 61m lexapro -CONTINUE zyprexa 164mQHS at night, 73m87mHS in the AM -START 1:1   #PTSD Home prazosin was continued.    #GAD He was started on hydroxyzine 273m2mN for anxiety.   #Cannabis use disorder He uses cannabis regularly.    #Legal issues He attempted suicide via hand gun, denies it is suicide attempt but is not allowed access to guns given he is on probation. Cops are pressing charges on him, might go to jail. He verbalized to mom that he would hang himself if he were to go to jail. Per social work, he will go to jail on discharge.  -1:1 given reported SI/HI this AM   #Tobacco use  -- Nicotine  polacrilex (gum) ordered, Nicotine patch 14mg36mhours ordered, and Smoking cessation encouraged   Agitation protocol: as needed oral haloperidol 5 mg, and lorazepam 2 mg, and diphenhydramine 50 mg with backup IM of similar meds  The risks/benefits/side-effects/alternatives to the above medication were discussed in detail with the patient and time was given for questions. The patient consents to medication trial. FDA black box warnings, if present, were discussed.  The patient is agreeable with the medication plan, as above. We will monitor the patient's response to pharmacologic treatment, and adjust medications as necessary.  3. Routine and other pertinent labs: EKG monitoring: QTc: 361 (10/28)  Lab Results:     Latest Ref Rng & Units 03/23/2022    8:15 PM 03/21/2022    8:09 PM 01/03/2021    8:28 PM  CBC  WBC 4.0 - 10.5 K/uL 6.7  8.0  4.7   Hemoglobin 13.0 - 17.0 g/dL 14.6  14.7  14.5   Hematocrit 39.0 - 52.0 % 42.5  42.4  42.2   Platelets 150 - 400 K/uL 257  228  259       Latest Ref Rng & Units 03/23/2022    8:15 PM 03/21/2022    8:09 PM 01/03/2021    8:28 PM  BMP  Glucose 70 - 99 mg/dL 86  79  100   BUN 6 - 20 mg/dL _0 Creatinine 0.61 - 1.24 mg/dL 1.19  1.21  1.27   Sodium 135 - 145 mmol/L 143  141  138   Potassium 3.5 - 5.1 mmol/L 3.5  3.7  4.2   Chloride 98 - 111 mmol/L 107  107  103   CO2 22 - 32 mmol/L _1 Calcium 8.9 - 10.3 mg/dL 9.6  9.6  9.6    Blood Alcohol level:  Lab Results  Component Value Date   ETH <10 03/23/2022   ETH <10 03/21/2022   Prolactin: Lab Results  Component Value Date   PROLACTIN 14.9 03/27/2022   PROLACTIN 4.2 07/18/2019   Lipid Panel: Lab Results  Component Value Date   CHOL 119 03/27/2022   TRIG 40 03/27/2022   HDL 34 (L) 03/27/2022   CHOLHDL 3.5 03/27/2022   VLDL 8 03/27/2022   LDLCALC 77 03/27/2022   LDLCALC 95 10/14/2019   HbgA1c: Lab Results  Component Value Date   HGBA1C 5.7 (H) 03/27/2022    TSH: Lab Results  Component Value Date   TSH 0.547 10/14/2019    4. Group Therapy: -- Encouraged patient to participate in unit milieu and in scheduled group therapies  -- Short Term Goals: Ability to identify changes in lifestyle to reduce recurrence of condition will  improve, Ability to verbalize feelings will improve, Ability to disclose and discuss suicidal ideas, Ability to identify and develop effective coping behaviors will improve, Ability to maintain clinical measurements within normal limits will improve, and Ability to identify triggers associated with substance abuse/mental health issues will improve -- Long Term Goals: Improvement in symptoms so as ready for discharge -- Patient is encouraged to participate in group therapy while admitted to the psychiatric unit. -- We will address other chronic and acute stressors, which contributed to the patient's MDD (major depressive disorder), recurrent, severe, with psychosis ([place]) in order to reduce the risk of self-harm at discharge.  5. Discharge Planning:  -- Social work and case management to assist with discharge planning and identification of hospital follow-up needs prior to discharge -- Estimated LOS: 5-7 days (dependent on SI) -- Discharge Concerns: Need to establish a safety plan; Medication compliance and effectiveness -- Discharge Goals: Return home with outpatient referrals for mental health follow-up including medication management/psychotherapy  I certify that inpatient services furnished can reasonably be expected to improve the patient's condition.   I discussed my assessment, planned testing and intervention for the patient with Dr. Winfred Leeds who agrees with my formulated course of action.  Angelique Holm, MS3 03/30/2022, 11:48 AM  I personally was present and performed or re-performed the history, physical exam and medical decision-making activities of this service and have verified that the service and findings are  accurately documented in the student's note.  Rolanda Lundborg, MD PGY-1

## 2022-03-30 NOTE — Progress Notes (Signed)
   03/30/22 1500  Psych Admission Type (Psych Patients Only)  Admission Status Involuntary  Psychosocial Assessment  Patient Complaints Anxiety;Depression  Eye Contact Fair  Facial Expression Anxious;Animated  Affect Anxious;Blunted;Depressed  Catering manager Activity Other (Comment) (WDL)  Appearance/Hygiene Unremarkable  Behavior Characteristics Cooperative;Appropriate to situation  Mood Anxious  Thought Process  Coherency Circumstantial  Content WDL  Delusions None reported or observed  Perception Hallucinations  Hallucination Auditory  Judgment Impaired  Confusion None  Danger to Self  Current suicidal ideation? Passive  Self-Injurious Behavior No self-injurious ideation or behavior indicators observed or expressed   Agreement Not to Harm Self Yes  Description of Agreement Verbal  Danger to Others  Danger to Others None reported or observed

## 2022-03-30 NOTE — Progress Notes (Signed)
1:1 note: Patient maintained on constant supervision for safety.  Patient currently denies suicidal thoughts.  Stated, "I can't hurt myself here only when I'm discharge from the hospital."  Patient presents with irritable affect and mood.  Routine safety checks maintained.  Patient visible in milieu for therapy.  Patient safe on the unit with supervision.

## 2022-03-30 NOTE — Group Note (Signed)
Therapy Type: Group Therapy  Participation Level:  Did Not Attend   Patients received a worksheet with an outline of 2 gingerbread men with a separation in the middle of the page. One sign designated what the pt sees about themselves and the other is what others see. Pts were asked to introduce themselves and share something they like about themself. Pts were then asked to draw, write or color how they view themselves as well as how they are viewed by others. CSW led discussion about the feelings and words associated with each side.   Patient Summary:    Pt did not attend.   Aquilla Voiles, LCSW, LCAS Clincal Social Worker  [facility]   

## 2022-03-30 NOTE — Progress Notes (Signed)
   03/30/22 0515  Sleep  Number of Hours 6.5

## 2022-03-30 NOTE — Progress Notes (Signed)
Patient ID: Roy Dudley, male   DOB: [birth year], 21 y.o.   MRN: [id number] 1:1 notes  03/30/2022 @ 2200  D: Patient calm and cooperative. Pt attended evening wrapup group and engaged in discussion..  A: 1:1 observation for safety. Medication administered as prescribed. R: Patient remains safe. Sitter at bedside. 1:1 continues

## 2022-03-30 NOTE — Progress Notes (Signed)
1:1 Note: Patient placed on constant supervision after reporting suicidal ideation to provider during assessment.  Patient unable to contracts for safety at this time due to concern about returning to jail after discharge.  Medication given as prescribed.  Support and encouragement offered for safety.  Patient is safe on the unit with supervision at this time.

## 2022-03-30 NOTE — Progress Notes (Signed)
1:1 Note: Patient maintained on constant supervision for safety.  Patient in his with anxious affect.  Vistaril 25 mg given for anxiety with good effect.  Routine safety checks maintained.  Patient is safe on the unit.

## 2022-03-30 NOTE — BHH Group Notes (Signed)
Spirituality group facilitated by Kathrynn Humble, [place].  Group Description: Group focused on topic of hope. Patients participated in facilitated discussion around topic, connecting with one another around experiences and definitions for hope. Group members engaged with visual explorer photos, reflecting on what hope looks like for them today. Group engaged in discussion around how their definitions of hope are present today in hospital.  Modalities: Psycho-social ed, Adlerian, Narrative, MI  Patient Progress: [name] attended group and participated in group conversation.  There were times when his comments were not on topic or tangential and other times when his comments showed fair insight into the topic.  [address] [place] Pager, [phone]

## 2022-03-30 NOTE — Group Note (Signed)
Recreation Therapy Group Note   Group Topic:Relaxation  Group Date: 03/30/2022 Start Time: 1005 End Time: 1030 Facilitators: Nivea Wojdyla-McCall, LRT,CTRS Location: [address] Dayroom   Goal Area(s) Addresses:  Patient will identify positive relaxation techniques. Patient will identify benefits of using relaxation post d/c.  Group Description: Music Therapy.  LRT introduced patients to the concept of using music as a tool of relaxation.  Patients were able to choose songs they liked and LRT would play them for the group.  The only stipulation was the songs had to be clean and appropriate for the session.    Affect/Mood: N/A   Participation Level: Did not attend    Clinical Observations/Individualized Feedback:     Plan: Continue to engage patient in RT group sessions 2-3x/week.   Roy Dudley, LRT,CTRS 03/30/2022 12:52 PM

## 2022-03-30 NOTE — Progress Notes (Signed)
Notified by nursing in the afternoon that patient was upset asking how did he get on 1:1. He is asking to see the provider because he does not want to be on 1:1. He says he was not saying he would hurt himself while he is here but when he gets out. Went back to discuss with patient. Discussed that he mentioned suicidal ideation this morning, did not contract for safety and louder voices, which he denied. Discussed 1:1 safety precaution given his report of suicidal ideation and the events leading up to his hospitalization as well as his subsequent minimizing. Discussed that we would re-evaluate 1:1 tomorrow. He expressed understanding and appreciation.

## 2022-03-30 NOTE — Group Note (Unsigned)
Occupational Therapy Group Note  Group Topic:Coping Skills  Group Date: 03/30/2022 Start Time: 1400 End Time: 1445 Facilitators: Aldeen Riga G, OT   Group Description: Group encouraged increased engagement and participation through discussion and activity focused on "Coping Ahead." Patients were split up into teams and selected a card from a stack of positive coping strategies. Patients were instructed to act out/charade the coping skill for other peers to guess and receive points for their team. Discussion followed with a focus on identifying additional positive coping strategies and patients shared how they were going to cope ahead over the weekend while continuing hospitalization stay.  Therapeutic Goal(s): Identify positive vs negative coping strategies. Identify coping skills to be used during hospitalization vs coping skills outside of hospital/at home Increase participation in therapeutic group environment and promote engagement in treatment   Participation Level: {OT B[facility] Participation Level:26267}   Participation Quality: {OT [facility] Participation Quality:26268}   Behavior: {BHH OT Group Behavior:26269}   Speech/Thought Process: {BHH OT Speech/Thought Process:26270}   Affect/Mood: {OT B[facility] Affect/Mood:26271}   Insight: {OT B[facility] Insight:26272}   Judgement: {OT B[facility] Judgement:26272}   Individualization: *** was *** in their participation of group discussion/activity. *** identified  Modes of Intervention: {BHH MODES OF INTERVENTION:26273}  Patient Response to Interventions:  {BHH OT Patient Response to Interventions:26274}   Plan: Continue to engage patient in OT groups 2 - 3x/week.  03/30/2022  Jeanette Moffatt G Genavie Boettger, OT   

## 2022-03-30 NOTE — B[facility] Group Notes (Signed)
Adult Psychoeducational Group Note  Date:  03/30/2022 Time:  8:35 PM  Group Topic/Focus:  Coping With Mental Health Crisis:   The purpose of this group is to help patients identify strategies for coping with mental health crisis.  Group discusses possible causes of crisis and ways to manage them effectively.  Participation Level:  Active  Participation Quality:  Appropriate  Affect:  Appropriate  Cognitive:  Alert  Insight: Good  Engagement in Group:  Engaged  Modes of Intervention:  Discussion  Additional Comments   Dalene Carrow 03/30/2022, 8:35 PM

## 2022-03-31 NOTE — Progress Notes (Signed)
BHH Post 1:1 Observation Documentation  For the first (8) hours following discontinuation of 1:1 precautions, a progress note entry by nursing staff should be documented at least every 2 hours, reflecting the patient's behavior, condition, mood, and conversation.  Use the progress notes for additional entries.  Time 1:1 discontinued:  1000  Patient's Behavior:  calm, cooperative  Patient's Condition:  in no acute distress  Patient's Conversation:  no voiced concerns.  Livana Yerian 03/31/2022, 2:17 PM 

## 2022-03-31 NOTE — Progress Notes (Signed)
Patient ID: Roy Dudley, male   DOB: [birth year], 21 y.o.   MRN: [id number]  1:1 notes  03/31/2022 @ 2200  D: Patient in bed sleeping. Respiration regular and unlabored. No sign of distress noted at this time A: 1:1 observation for safety R: Patient remains asleep. Sitter at bedside. 1:1 continues

## 2022-03-31 NOTE — Progress Notes (Signed)
[facility] 1:1 Observation Documentation  For the first (8) hours following discontinuation of 1:1 precautions, a progress note entry by nursing staff should be documented at least every 2 hours, reflecting the patient's behavior, condition, mood, and conversation.  Use the progress notes for additional entries.  Time 1:1 discontinued:  1000  Patient's Behavior:  cooperative, calm   Patient's Condition:  in no acute distress  Patient's Conversation:  no voiced concerns  Leonia Reader 03/31/2022, 6:07 PM
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
## 2022-03-31 NOTE — Progress Notes (Signed)
Patient ID: Roy Dudley, male   DOB: [birth year], 21 y.o.   MRN: [id number] 1:1 notes  03/31/2022 @ 0600  D: Patient in bed sleeping. Respiration regular and unlabored. No sign of distress noted at this time A: 1:1 observation for safety R: Patient remains asleep. Sitter at bedside. 1:1 continues

## 2022-03-31 NOTE — Progress Notes (Signed)
[facility] 1:1 Observation Documentation  For the first (8) hours following discontinuation of 1:1 precautions, a progress note entry by nursing staff should be documented at least every 2 hours, reflecting the patient's behavior, condition, mood, and conversation.  Use the progress notes for additional entries.  Time 1:1 discontinued:  1000  Patient's Behavior:  calm, cooperative  Patient's Condition:  in no acute distress  Patient's Conversation:  no voiced concerns.  Leonia Reader 03/31/2022, 2:17 PM

## 2022-03-31 NOTE — Plan of Care (Signed)
  Problem: Activity: Goal: Will verbalize the importance of balancing activity with adequate rest periods Outcome: Progressing   Problem: Education: Goal: Will be free of psychotic symptoms Outcome: Progressing Goal: Knowledge of the prescribed therapeutic regimen will improve Outcome: Progressing   Problem: Coping: Goal: Coping ability will improve Outcome: Progressing Goal: Will verbalize feelings Outcome: Progressing   Problem: Health Behavior/Discharge Planning: Goal: Compliance with prescribed medication regimen will improve Outcome: Progressing   
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
## 2022-03-31 NOTE — Progress Notes (Signed)
[place] 1:1 Observation Documentation  For the first (8) hours following discontinuation of 1:1 precautions, a progress note entry by nursing staff should be documented at least every 2 hours, reflecting the patient's behavior, condition, mood, and conversation.  Use the progress notes for additional entries.  Time 1:1 discontinued:  1000  Patient's Behavior:  calm  Patient's Condition:  in no acute distress  Patient's Conversation:  no voiced concerns.  Leonia Reader 03/31/2022, 6:07 PM

## 2022-03-31 NOTE — Progress Notes (Signed)
   03/31/22 2200  Psych Admission Type (Psych Patients Only)  Admission Status Involuntary  Psychosocial Assessment  Patient Complaints Anxiety  Eye Contact Fair  Facial Expression Animated  Affect Appropriate to circumstance  Speech Logical/coherent  Interaction Assertive  Motor Activity Fidgety  Appearance/Hygiene Improved  Behavior Characteristics Cooperative  Mood Anxious  Thought Process  Coherency WDL  Content WDL  Delusions None reported or observed  Perception Hallucinations  Hallucination Auditory;Visual  Judgment Impaired  Confusion Mild  Danger to Self  Current suicidal ideation? Denies  Self-Injurious Behavior No self-injurious ideation or behavior indicators observed or expressed   Agreement Not to Harm Self Yes  Description of Agreement verbal contract  Danger to Others  Danger to Others None reported or observed   D: Patient in dayroom visiting with twin sister. Pt stated he is happy she visited but wish he was going home with her. Pt endorses A/VH without command and stated the "designs on his shirt are all people he talks to all the time".  A: Medications administered as prescribed. Support and encouragement provided as needed.  R: Patient remains safe on the unit. Plan of care ongoing for safety and stability.
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
## 2022-03-31 NOTE — Group Note (Signed)
  [facility] LCSW Group Therapy Note  Date/Time:  03/31/2022 11:15AM-12:00PM  Type of Therapy and Topic:  Group Therapy:  Feelings About Hospitalization  Participation Level:  Active   Description of Group This process group involved patients discussing their feelings related to being hospitalized, as well as the benefits they see to being in the hospital.  These feelings and benefits were itemized.  The group then brainstormed specific ways in which they could seek those same benefits when they discharge and return home.  Therapeutic Goals Patient will identify and describe positive and negative feelings related to hospitalization Patient will verbalize benefits of hospitalization to themselves personally Patients will brainstorm together ways they can obtain similar benefits in the outpatient setting, identify barriers to wellness and possible solutions  Summary of Patient Progress:  The patient expressed his primary feelings about being hospitalized are that it does not feel normal, but by being here he has realized that everybody deals with mental illness so he has become more comfortable.  He likes that it is a judgement-free zone, but is still mad he is not out working in order to pay his bills.  He stated his goal at discharge is to continue to have healing time by using his religion.  Therapeutic Modalities Cognitive Behavioral Therapy Motivational Interviewing    Selmer Dominion, LCSW 03/31/2022, 6:21 PM

## 2022-03-31 NOTE — Group Note (Signed)
Occupational Therapy Group Note  Group Topic:Coping Skills  Group Date: 03/29/2022 Start Time: 1400 End Time: 1445 Facilitators: Brantley Stage, OT   Group Description: Group encouraged increased engagement and participation through discussion and activity focused on "Coping Ahead." Patients were split up into teams and selected a card from a stack of positive coping strategies. Patients were instructed to act out/charade the coping skill for other peers to guess and receive points for their team. Discussion followed with a focus on identifying additional positive coping strategies and patients shared how they were going to cope ahead over the weekend while continuing hospitalization stay.  Therapeutic Goal(s): Identify positive vs negative coping strategies. Identify coping skills to be used during hospitalization vs coping skills outside of hospital/at home Increase participation in therapeutic group environment and promote engagement in treatment   Participation Level: Active   Participation Quality: Independent   Behavior: Appropriate   Speech/Thought Process: Coherent   Affect/Mood: Appropriate   Insight: Fair   Judgement: Fair   Individualization: pt was active / engaged in their participation of group discussion/activity. New skills identified  Modes of Intervention: Discussion  Patient Response to Interventions:  Attentive   Plan: Continue to engage patient in OT groups 2 - 3x/week.  03/31/2022  Brantley Stage, OT Cornell Barman, OT

## 2022-03-31 NOTE — Progress Notes (Addendum)
[facility] MD Resident Progress Note  03/31/2022 11:30 AM Roy Dudley  MRN:  [id number] Principal Problem: MDD (major depressive disorder), recurrent, severe, with psychosis (HCC) Diagnosis: Principal Problem:   MDD (major depressive disorder), recurrent, with psychotic features Active Problems:   PTSD (post-traumatic stress disorder)   Generalized anxiety disorder   Cannabis use disorder  Reason for admission   Roy Dudley is a 21 y.o., male with a medical history significant for Y-chromosome microdeletion, learning disability, scaphoencephaly and past psychiatric history significant for ADHD, PTSD, ODD who presents to the [facility] Involuntary from [facility] Emergency Department for evaluation and management of suicide attempt, and "mental breakdown." (admitted on 03/26/2022, total  LOS: 5 days )  Chart review from last 24 hours   The patient's chart was reviewed and nursing notes were reviewed. The patient's case was discussed in multidisciplinary team meeting.  - Overnight events per chart review: Did not attend RT group.  With anxious affect, Vistaril given.  - Patient received all scheduled meds besides the vaccines - Patient received as needed Tylenol, Atarax (1646, 2245), nicotine gum, Zyprexa 5 (1646), trazodone PRN meds.  Discussed with nursing during treatment team who were not aware of why as needed Zyprexa was administered yesterday. No note was written.  Per nursing, patient is forward thinking with goals this morning.  Information obtained during interview   The patient was seen and evaluated on the unit. On assessment today the patient reports it was hard to fall and stay asleep.  He reports dinner was "nasty. Soggy cheese sandwich."  He had not eaten breakfast yet this morning.  He denied chest pain shortness of breath, abdominal pain.  He reports "only 2 demons."  He reports the voices are "softer than yesterday, not telling me to hurt myself."  He continues to  report that he can "make the wind blow with my spirit."  When asked how, he reports "I can tap into the spirit."  He denies suicidal ideation this morning, denies homicidal ideation.  He reports that he wants to be discharged and " see my family.  Calling is not enough."  He asked to be removed from the one-on-one, "I want to play ball."  Review of Systems  Respiratory:  Negative for shortness of breath.   Cardiovascular:  Negative for chest pain.  Gastrointestinal:  Negative for abdominal pain, constipation, diarrhea, nausea and vomiting.  Neurological:  Negative for headaches.   Objective   Blood pressure (!) 152/78, pulse 78, temperature 98.2 F (36.8 C), temperature source Oral, resp. rate 18, height 6\' 1"  (1.854 m), weight 65.8 kg, SpO2 100 %. Body mass index is 19.13 kg/m. Sleep: 5.5 hours Current Medications: Current Facility-Administered Medications  Medication Dose Route Frequency Provider Last Rate Last Admin   acetaminophen (TYLENOL) tablet 650 mg  650 mg Oral Q6H PRN , NP   650 mg at 03/31/22 0817   alum & mag hydroxide-simeth (MAALOX/MYLANTA) 200-200-20 MG/5ML suspension 30 mL  30 mL Oral Q4H PRN 12-04-2000, NP       haloperidol (HALDOL) tablet 5 mg  5 mg Oral TID PRN Oneta Rack, MD   5 mg at 03/27/22 1403   And   LORazepam (ATIVAN) tablet 2 mg  2 mg Oral TID PRN 03/29/22, MD       And   diphenhydrAMINE (BENADRYL) capsule 50 mg  50 mg Oral TID PRN Massengill, Phineas Inches, MD       haloperidol lactate (HALDOL) injection 5  mg  5 mg Intramuscular TID PRN Massengill, Harrold Donath, MD       And   LORazepam (ATIVAN) injection 2 mg  2 mg Intramuscular TID PRN Massengill, Harrold Donath, MD       And   diphenhydrAMINE (BENADRYL) injection 50 mg  50 mg Intramuscular TID PRN Massengill, Harrold Donath, MD       escitalopram (LEXAPRO) tablet 20 mg  20 mg Oral Daily Karie Fetch, MD   20 mg at 03/31/22 3536   hydrOXYzine (ATARAX) tablet 25 mg  25 mg Oral Q6H PRN Augusto Gamble, MD   25 mg at 03/30/22 2245   influenza vac split quadrivalent PF (FLUARIX) injection 0.5 mL  0.5 mL Intramuscular Tomorrow-1000 Attiah, Nadir, MD       magnesium hydroxide (MILK OF MAGNESIA) suspension 30 mL  30 mL Oral Daily PRN Oneta Rack, NP       nicotine polacrilex (NICORETTE) gum 2 mg  2 mg Oral Q6H PRN Augusto Gamble, MD   2 mg at 03/31/22 0921   OLANZapine (ZYPREXA) tablet 10 mg  10 mg Oral QHS Karie Fetch, MD   10 mg at 03/30/22 2040   OLANZapine (ZYPREXA) tablet 5 mg  5 mg Oral Q6H PRN Abbott Pao, Nadir, MD   5 mg at 03/30/22 1646   OLANZapine (ZYPREXA) tablet 5 mg  5 mg Oral Daily Attiah, Nadir, MD   5 mg at 03/31/22 0817   pneumococcal 20-valent conjugate vaccine (PREVNAR 20) injection 0.5 mL  0.5 mL Intramuscular Tomorrow-1000 Attiah, Nadir, MD       prazosin (MINIPRESS) capsule 1 mg  1 mg Oral QHS Augusto Gamble, MD   1 mg at 03/30/22 2040   traZODone (DESYREL) tablet 50 mg  50 mg Oral QHS PRN Oneta Rack, NP   50 mg at 03/30/22 2040   Labs: pending GC/Chlamydia   Physical Exam Constitutional:      Appearance: the patient is not toxic-appearing.  Pulmonary:     Effort: Pulmonary effort is normal.  Neurological:     General: No focal deficit present.     Mental Status: the patient is alert and oriented to person, place, and time.   AIMS: Facial and Oral Movements Muscles of Facial Expression: None, normal Lips and Perioral Area: Occasional Jaw: None, normal Tongue: None, normal,Extremity Movements Upper (arms, wrists, hands, fingers): - Lower (legs, knees, ankles, toes): - Trunk Movements: - Neck, shoulders, hips: -  Overall Severity Severity of abnormal movements (highest score from questions above): None, normal Incapacitation due to abnormal movements: None, normal Patient's awareness of abnormal movements (rate only patient's report): No Awareness, Dental Status Current problems with teeth and/or dentures?: No Does patient usually wear dentures?: No    No stiffness or cogwheeling noted on exam.   Mental Status Exam   Appearance and Grooming: Patient is partially unclothed without shirt on, laying on the bed. The patient has no noticeable scent or odor. Motor activity: The patient's movement speed was normal; his gait was not observed during encounter. There was no notable abnormal facial movements and no notable abnormal extremity movements. Behavior: The patient appears in no acute distress, and during the interview, was calm, focused, required minimal redirection, and behaving appropriately to scenario; he was able to follow commands and compliant to requests and made good eye contact. The patient did not appear internally or externally preoccupied. Attitude: Patient's attitude towards the interviewer was cooperative and open. Speech: The patient's speech was clear, fluent, with good articulation, and with  appropriately placed inflections. The volume of his speech was normal and normal in quantity. The rate was normal with a normal rhythm. Responses were normal in latency. There were no abnormal patterns in speech. Mood: "Want to see my family" Affect: Patient's affect is irritable with broad range and even fluctuations; his affect is congruent with his stated mood. ------------------------------------------------------------------------------------------------------------------------- Thought Content The patient experiences visual hallucinations, specifically, 2 demons . The patient describes  delusions that he can make the wind blow with his spirit ; he denies thought insertion, denies thought withdrawal, denies thought interruption, and denies thought broadcasting. Patient at the time of interview denies active suicidal intent and denies passive suicidal ideation; he denies homicidal intent. Thought Process The patient's thought process is linear and is goal-directed. Insight The patient at the time of interview demonstrates fair  insight, as evidenced by ability to identify adaptive and maladaptive coping strategies. Judgement The patient over the past 24 hours demonstrates fair judgement, as evidenced by adhering to medication regimen and engaging appropriately with staff / other patients.  Assets: Manufacturing systems engineerCommunication Skills; Financial Resources/Insurance; Social Support  Treatment Plan Summary: Daily contact with patient to assess and evaluate symptoms and progress in treatment and Medication management Summary   Elihue Dimple CaseyRice is a 21 y.o., male with a medical history significant for Y-chromosome microdeletion, learning disability, scaphoencephaly and past psychiatric history significant for ADHD, PTSD, ODD who [facility] for evaluation and management of suicide attempt, and "mental breakdown", current diagnosis is MDD with psychotic features given his hx of MDD prior to admission. However, concern for paranoid schizophrenia given patient's family history and ongoing delusions and hallucinations.  Patient presents much more forward thinking with goals this morning.  He is denying suicidal ideation and engaging appropriately with staff.  His hallucinations have decreased.  Given these factors, we will discontinue his one-on-one.  Diagnoses / Active Problems: MDD (major depressive disorder), recurrent, severe, with psychosis (HCC) Principal Problem:   MDD (major depressive disorder), recurrent, with psychotic features Active Problems:   PTSD (post-traumatic stress disorder)   Generalized anxiety disorder   Cannabis use disorder  Plan  Safety and Monitoring: -- Involuntary admission to inpatient psychiatric unit for safety, stabilization and treatment -- Daily contact with patient to assess and evaluate symptoms and progress in treatment -- Patient's case to be discussed in multi-disciplinary team meeting -- Observation Level : Every 15 min given  patient interacting appropriately with staff, reporting forward thinking, decreased psychosis. -- Vital signs:  q12 hours -- Precautions: suicide, elopement, and assault  2. Medications:  #MDD with psychosis (r/o paranoid schizophrenia) Pt has decreasing psychosis this AM.  We will continue his Zyprexa a.m. and p.m. given his decompensation yesterday.  He does not appear overly sedated this morning. -CONTINUE 20mg  lexapro -CONTINUE zyprexa 10mg  QHS at night, 5mg  daily in the AM -Stop 1:1   #PTSD Home prazosin was continued.    #GAD He was started on hydroxyzine 25mg  PRN for anxiety.   #Cannabis use disorder He uses cannabis regularly.    #Legal issues He attempted suicide via hand gun, denies it is suicide attempt but is not allowed access to guns given he is on probation. Cops are pressing charges on him, might go to jail. He verbalized to mom that he would hang himself if he were to go to jail. Per social work, he will go to jail on discharge.    #Tobacco use  -- Nicotine polacrilex (gum)  ordered, Nicotine patch 14mg /24 hours ordered, and Smoking cessation encouraged   Agitation protocol: as needed oral haloperidol 5 mg, and lorazepam 2 mg, and diphenhydramine 50 mg with backup IM of similar meds  The risks/benefits/side-effects/alternatives to the above medication were discussed in detail with the patient and time was given for questions. The patient consents to medication trial. FDA black box warnings, if present, were discussed.  The patient is agreeable with the medication plan, as above. We will monitor the patient's response to pharmacologic treatment, and adjust medications as necessary.  3. Routine and other pertinent labs: EKG monitoring: QTc: 361 (10/28)  Lab Results:     Latest Ref Rng & Units 03/23/2022    8:15 PM 03/21/2022    8:09 PM 01/03/2021    8:28 PM  CBC  WBC 4.0 - 10.5 K/uL 6.7  8.0  4.7   Hemoglobin 13.0 - 17.0 g/dL 03/05/2021  38.1  01.7   Hematocrit 39.0 -  52.0 % 42.5  42.4  42.2   Platelets 150 - 400 K/uL 257  228  259       Latest Ref Rng & Units 03/23/2022    8:15 PM 03/21/2022    8:09 PM 01/03/2021    8:28 PM  BMP  Glucose 70 - 99 mg/dL 86  79  03/05/2021   BUN 6 - 20 mg/dL 10  13  11    Creatinine 0.61 - 1.24 mg/dL 258   5.27   Sodium 135 - 145 mmol/L 143  141  138   Potassium 3.5 - 5.1 mmol/L 3.5  3.7  4.2   Chloride 98 - 111 mmol/L 107  107  103   CO2 22 - 32 mmol/L 29  23  27    Calcium 8.9 - 10.3 mg/dL 9.6  9.6  9.6    Blood Alcohol level:  Lab Results  Component Value Date   ETH <10 03/23/2022   ETH <10 03/21/2022   Prolactin: Lab Results  Component Value Date   PROLACTIN 14.9 03/27/2022   PROLACTIN 4.2 07/18/2019   Lipid Panel: Lab Results  Component Value Date   CHOL 119 03/27/2022   TRIG 40 03/27/2022   HDL 34 (L) 03/27/2022   CHOLHDL 3.5 03/27/2022   VLDL 8 03/27/2022   LDLCALC 77 03/27/2022   LDLCALC 95 10/14/2019   HbgA1c: Lab Results  Component Value Date   HGBA1C 5.7 (H) 03/27/2022   TSH: Lab Results  Component Value Date   TSH 0.547 10/14/2019    4. Group Therapy: -- Encouraged patient to participate in unit milieu and in scheduled group therapies  -- Short Term Goals: Ability to identify changes in lifestyle to reduce recurrence of condition will improve, Ability to verbalize feelings will improve, Ability to disclose and discuss suicidal ideas, Ability to identify and develop effective coping behaviors will improve, Ability to maintain clinical measurements within normal limits will improve, and Ability to identify triggers associated with substance abuse/mental health issues will improve -- Long Term Goals: Improvement in symptoms so as ready for discharge -- Patient is encouraged to participate in group therapy while admitted to the psychiatric unit. -- We will address other chronic and acute stressors, which contributed to the patient's MDD (major depressive disorder), recurrent, severe, with  psychosis (HCC) in order to reduce the risk of self-harm at discharge.   5. Discharge Planning:  -- Social work and case management to assist with discharge planning and identification of hospital follow-up needs prior to discharge -- Estimated  LOS: 5-7 days (dependent on SI) -- Discharge Concerns: Need to establish a safety plan; Medication compliance and effectiveness -- Discharge Goals: Return home with outpatient referrals for mental health follow-up including medication management/psychotherapy  I certify that inpatient services furnished can reasonably be expected to improve the patient's condition.   I discussed my assessment, planned testing and intervention for the patient with Dr. Winfred Leeds who agrees with my formulated course of action.  Rolanda Lundborg, MD, PGY-1 03/31/2022, 11:30 AM

## 2022-03-31 NOTE — [facility] Group Notes (Signed)
Psychoeducational Group- Goals Group.  Patient attended group, states his mood is '' okay'' and his goal is to talk to treatment team about discharge.

## 2022-03-31 NOTE — Progress Notes (Signed)
Pt self inventory reports poor sleep and that requested meds did not help. Pt rates depression 7/10, hopelessness 6/10, and anxiety 5/10. His daily goal is to go home and he plans to meet that goal by working on skills.

## 2022-03-31 NOTE — Progress Notes (Signed)
[facility] 1:1 Observation Documentation  For the first (8) hours following discontinuation of 1:1 precautions, a progress note entry by nursing staff should be documented at least every 2 hours, reflecting the patient's behavior, condition, mood, and conversation.  Use the progress notes for additional entries.  Time 1:1 discontinued:  0951  Patient's Behavior:  calm  Patient's Condition:  in no acute distress  Patient's Conversation:  no voiced concerns.  Leonia Reader 03/31/2022, 9:51 AM

## 2022-04-01 MED ORDER — TRAZODONE HCL 100 MG PO TABS
100.0000 mg | ORAL_TABLET | Freq: Every evening | ORAL | Status: DC | PRN
  Administered 2022-04-01 – 2022-04-03 (×3): 100 mg via ORAL
  Filled 2022-04-01 (×3): qty 1

## 2022-04-01 MED ORDER — OLANZAPINE 7.5 MG PO TABS
15.0000 mg | ORAL_TABLET | Freq: Every day | ORAL | Status: DC
  Administered 2022-04-01 – 2022-04-03 (×3): 15 mg via ORAL
  Filled 2022-04-01 (×5): qty 2
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
## 2022-04-01 NOTE — Progress Notes (Signed)
Adult Psychoeducational Group Note  Date:  04/01/2022 Time:  8:17 PM  Group Topic/Focus:  Wrap-Up Group:   The focus of this group is to help patients review their daily goal of treatment and discuss progress on daily workbooks.  Participation Level:  Active  Participation Quality:  Redirectable  Affect:  Labile  Cognitive:  Delusional  Insight: Lacking  Engagement in Group:  Distracting and Off Topic  Modes of Intervention:  Discussion and Support  Additional Comments:    Pt attended and participated in the [place] group. Pt blurted out several times during the group as peers were sharing. Pt receptive to redirection. Pt rated his  day 10/10. Pt reports making some progress toward his goal of "trying to ho home". "I found myself and now I'm lit". Pt redirected his attention to his peers and attempted to recruit his peers to sign up for the "war" getting a Teacher, English as a foreign language to elude the police and to be a drug dealer. Pt receptive to redirection to refocus on the  group topic. Pt identified counting to a million as an effective coping strategy. Pt unable to stay on topic, resumed war talk, and discussion around secret society.  Wetzel Bjornstad Brooks Stotz 04/01/2022, 8:17 PM

## 2022-04-01 NOTE — [facility] Group Notes (Signed)
Adult Psychoeducational Group Note Date:  04/01/2022 Time:  0900-1000 Group Topic/Focus: PROGRESSIVE RELAXATION. A group where deep breathing is taught and tensing and relaxation muscle groups is used. Imagery is used as well.  Pts are asked to imagine 3 pillars that hold them up when they are not able to hold themselves up and to share that with the group.   Participation Level:  Active  Participation Quality:  Appropriate  Affect:  Appropriate  Cognitive:  Approprate  Insight: Improving  Engagement in Group:  Engaged  Modes of Intervention:  deep breathing, Imagery. Discussion  Additional Comments:  Energy is a 9/10./ States that God, his highter self and medication hold him up.   : Roy Dudley

## 2022-04-01 NOTE — Progress Notes (Signed)
[facility] MD Resident Progress Note  04/01/2022 11:00 AM Roy Dudley  MRN:  [id number] Principal Problem: MDD (major depressive disorder), recurrent, severe, with psychosis (HCC) Diagnosis: Principal Problem:   MDD (major depressive disorder), recurrent, with psychotic features Active Problems:   PTSD (post-traumatic stress disorder)   Generalized anxiety disorder   Cannabis use disorder  Reason for admission   Roy Dudley is a 21 y.o., male with a medical history significant for Y-chromosome microdeletion, learning disability, scaphoencephaly and past psychiatric history significant for ADHD, PTSD, ODD who presents to the [facility] Involuntary from [facility] for evaluation and management of suicide attempt, and "mental breakdown." (admitted on 03/26/2022, total  LOS: 6 days )  Chart review from last 24 hours   The patient's chart was reviewed and nursing notes were reviewed. The patient's case was discussed in multidisciplinary team meeting.  - Overnight events per chart review: Per staff no agitation reported withdrawn at times Compliant with scheduled medications, using Atarax as needed for anxiety last night at 10 PM as well as trazodone last night for sleep.  Information obtained during interview   The patient was seen and evaluated on the unit.  Patient presents lying down in bed reporting had trouble falling asleep related to seeing demons, reports improving auditory hallucinations remains to be commanding at times for him to harm himself but decreased intensity and frequency and he describes he is able to distract himself from voices.  He denies active SI intention or plan denies HI.  Denies paranoia or other delusions while here in the hospital.  Continues to be stressed out regard to going to jail but notes he is hopeful that he will be served a warrant and then go home.  Review of Systems  Respiratory:  Negative for shortness of breath.    Cardiovascular:  Negative for chest pain.  Gastrointestinal:  Negative for abdominal pain, constipation, diarrhea, nausea and vomiting.  Neurological:  Negative for headaches.   Objective   Blood pressure 137/66, pulse (!) 53, temperature 97.9 F (36.6 C), temperature source Oral, resp. rate 18, height 6\' 1"  (1.854 m), weight 65.8 kg, SpO2 100 %. Body mass index is 19.13 kg/m. Sleep: 5.5 hours Current Medications: Current Facility-Administered Medications  Medication Dose Route Frequency Provider Last Rate Last Admin   acetaminophen (TYLENOL) tablet 650 mg  650 mg Oral Q6H PRN , NP   650 mg at 03/31/22 0817   alum & mag hydroxide-simeth (MAALOX/MYLANTA) 200-200-20 MG/5ML suspension 30 mL  30 mL Oral Q4H PRN 12-04-2000, NP       haloperidol (HALDOL) tablet 5 mg  5 mg Oral TID PRN Oneta Rack, MD   5 mg at 03/27/22 1403   And   LORazepam (ATIVAN) tablet 2 mg  2 mg Oral TID PRN 03/29/22, MD       And   diphenhydrAMINE (BENADRYL) capsule 50 mg  50 mg Oral TID PRN Massengill, Phineas Inches, MD       haloperidol lactate (HALDOL) injection 5 mg  5 mg Intramuscular TID PRN Massengill, Harrold Donath, MD       And   LORazepam (ATIVAN) injection 2 mg  2 mg Intramuscular TID PRN Massengill, Harrold Donath, MD       And   diphenhydrAMINE (BENADRYL) injection 50 mg  50 mg Intramuscular TID PRN Massengill, Harrold Donath, MD       escitalopram (LEXAPRO) tablet 20 mg  20 mg Oral Daily Harrold Donath, MD   20 mg  at 03/31/22 0817   hydrOXYzine (ATARAX) tablet 25 mg  25 mg Oral Q6H PRN Camelia Phenes, MD   25 mg at 03/31/22 2210   influenza vac split quadrivalent PF (FLUARIX) injection 0.5 mL  0.5 mL Intramuscular Tomorrow-1000 Zane Samson, MD       magnesium hydroxide (MILK OF MAGNESIA) suspension 30 mL  30 mL Oral Daily PRN Derrill Center, NP       nicotine polacrilex (NICORETTE) gum 2 mg  2 mg Oral Q6H PRN Camelia Phenes, MD   2 mg at 03/31/22 1415   OLANZapine (ZYPREXA) tablet 10 mg  10 mg  Oral QHS Rolanda Lundborg, MD   10 mg at 03/31/22 2021   OLANZapine (ZYPREXA) tablet 5 mg  5 mg Oral Q6H PRN Winfred Leeds, Brayn Eckstein, MD   5 mg at 03/30/22 1646   OLANZapine (ZYPREXA) tablet 5 mg  5 mg Oral Daily Shalynn Jorstad, MD   5 mg at 03/31/22 0817   pneumococcal 20-valent conjugate vaccine (PREVNAR 20) injection 0.5 mL  0.5 mL Intramuscular Tomorrow-1000 Arya Luttrull, MD       prazosin (MINIPRESS) capsule 1 mg  1 mg Oral QHS Camelia Phenes, MD   1 mg at 03/31/22 2021   traZODone (DESYREL) tablet 50 mg  50 mg Oral QHS PRN Derrill Center, NP   50 mg at 03/31/22 2021   Labs: pending GC/Chlamydia   Physical Exam Constitutional:      Appearance: the patient is not toxic-appearing.  Pulmonary:     Effort: Pulmonary effort is normal.  Neurological:     General: No focal deficit present.     Mental Status: the patient is alert and oriented to person, place, and time.   AIMS: Facial and Oral Movements Muscles of Facial Expression: None, normal Lips and Perioral Area: Occasional Jaw: None, normal Tongue: None, normal,Extremity Movements Upper (arms, wrists, hands, fingers): - Lower (legs, knees, ankles, toes): - Trunk Movements: - Neck, shoulders, hips: -  Overall Severity Severity of abnormal movements (highest score from questions above): None, normal Incapacitation due to abnormal movements: None, normal Patient's awareness of abnormal movements (rate only patient's report): No Awareness, Dental Status Current problems with teeth and/or dentures?: No Does patient usually wear dentures?: No   No stiffness or cogwheeling noted on exam.   Mental Status Exam   Appearance and Grooming: Fairly dressed and groomed Motor activity: Mild retardation noted Behavior: Calm Attitude: Cooperative Speech: Decreased amount in general, decreased tone and volume Mood: Dysphoric Affect: Restricted to flat  affect ------------------------------------------------------------------------------------------------------------------------- Thought Content Denies SI or HI, admits to related hallucinations occasionally commanding him to harm himself but reports he is able to distract himself and not follow the voices, reports visual hallucinations seeing demons shapes that kept him up from sleeping last night. Thought Process Linear and goal directed yes concrete Insight Limited Judgement Limited  Assets: Communication Skills; Financial Resources/Insurance; Social Support  Treatment Plan Summary: Daily contact with patient to assess and evaluate symptoms and progress in treatment and Medication management Summary   Roy Dudley is a 21 y.o., male with a medical history significant for Y-chromosome microdeletion, learning disability, scaphoencephaly and past psychiatric history significant for ADHD, PTSD, ODD who presents to the [facility] Involuntary from [facility] Emergency Department for evaluation and management of suicide attempt, and "mental breakdown", current diagnosis is MDD with psychotic features given his hx of MDD prior to admission. However, concern for paranoid schizophrenia given patient's family history and ongoing delusions and hallucinations.  Patient presents much more forward thinking with goals this morning.  He is denying suicidal ideation and engaging appropriately with staff.  His hallucinations have decreased.  Given these factors, we will discontinue his one-on-one.  Diagnoses / Active Problems: MDD (major depressive disorder), recurrent, severe, with psychosis (HCC) Principal Problem:   MDD (major depressive disorder), recurrent, with psychotic features Active Problems:   PTSD (post-traumatic stress disorder)   Generalized anxiety disorder   Cannabis use disorder  Plan  Safety and Monitoring: -- Involuntary admission to inpatient psychiatric unit for  safety, stabilization and treatment -- Daily contact with patient to assess and evaluate symptoms and progress in treatment -- Patient's case to be discussed in multi-disciplinary team meeting -- Observation Level : Every 15 min given patient interacting appropriately with staff, reporting forward thinking, decreased psychosis. -- Vital signs:  q12 hours -- Precautions: suicide, elopement, and assault  2. Medications:  #MDD with psychosis (r/o paranoid schizophrenia) -CONTINUE 20mg  lexapro for depression Titrate Zyprexa from 5 mg in the morning and 10 mg at bedtime to 5 mg in the morning and 50 mg at bedtime to help further with psychosis, monitor efficacy and safety Increase trazodone at bedtime as needed for sleep from 50 to 100 mg and monitor efficacy and safety   #PTSD Home prazosin was continued.    #GAD He was started on hydroxyzine 25mg  PRN for anxiety.   #Cannabis use disorder He uses cannabis regularly.    #Legal issues He attempted suicide via hand gun, denies it is suicide attempt but is not allowed access to guns given he is on probation. Cops are pressing charges on him, might go to jail. He verbalized to mom that he would hang himself if he were to go to jail. Per social work, he will go to jail on discharge.    #Tobacco use  -- Nicotine polacrilex (gum) ordered, Nicotine patch 14mg /24 hours ordered, and Smoking cessation encouraged   Agitation protocol: as needed oral haloperidol 5 mg, and lorazepam 2 mg, and diphenhydramine 50 mg with backup IM of similar meds  The risks/benefits/side-effects/alternatives to the above medication were discussed in detail with the patient and time was given for questions. The patient consents to medication trial. FDA black box warnings, if present, were discussed.  The patient is agreeable with the medication plan, as above. We will monitor the patient's response to pharmacologic treatment, and adjust medications as necessary.  3.  Routine and other pertinent labs: EKG monitoring: QTc: 361 (10/28)  Lab Results:     Latest Ref Rng & Units 03/23/2022    8:15 PM 03/21/2022    8:09 PM 01/03/2021    8:28 PM  CBC  WBC 4.0 - 10.5 K/uL 6.7  8.0  4.7   Hemoglobin 13.0 - 17.0 g/dL 03/25/2022  03/23/2022  03/05/2021   Hematocrit 39.0 - 52.0 % 42.5  42.4  42.2   Platelets 150 - 400 K/uL 257  228  259       Latest Ref Rng & Units 03/23/2022    8:15 PM 03/21/2022    8:09 PM 01/03/2021    8:28 PM  BMP  Glucose 70 - 99 mg/dL 86  79  03/25/2022   BUN 6 - 20 mg/dL 10  13  11    Creatinine 0.61 - 1.24 mg/dL 03/23/2022  03/05/2021  115   Sodium 135 - 145 mmol/L 143  141  138   Potassium 3.5 - 5.1 mmol/L 3.5  3.7  4.2   Chloride 98 -  111 mmol/L 107  107  103   CO2 22 - 32 mmol/L 29  23  27    Calcium 8.9 - 10.3 mg/dL 9.6  9.6  9.6    Blood Alcohol level:  Lab Results  Component Value Date   ETH <10 03/23/2022   ETH <10 03/21/2022   Prolactin: Lab Results  Component Value Date   PROLACTIN 14.9 03/27/2022   PROLACTIN 4.2 07/18/2019   Lipid Panel: Lab Results  Component Value Date   CHOL 119 03/27/2022   TRIG 40 03/27/2022   HDL 34 (L) 03/27/2022   CHOLHDL 3.5 03/27/2022   VLDL 8 03/27/2022   LDLCALC 77 03/27/2022   LDLCALC 95 10/14/2019   HbgA1c: Lab Results  Component Value Date   HGBA1C 5.7 (H) 03/27/2022   TSH: Lab Results  Component Value Date   TSH 0.547 10/14/2019    4. Group Therapy: -- Encouraged patient to participate in unit milieu and in scheduled group therapies  -- Short Term Goals: Ability to identify changes in lifestyle to reduce recurrence of condition will improve, Ability to verbalize feelings will improve, Ability to disclose and discuss suicidal ideas, Ability to identify and develop effective coping behaviors will improve, Ability to maintain clinical measurements within normal limits will improve, and Ability to identify triggers associated with substance abuse/mental health issues will improve -- Long Term Goals:  Improvement in symptoms so as ready for discharge -- Patient is encouraged to participate in group therapy while admitted to the psychiatric unit. -- We will address other chronic and acute stressors, which contributed to the patient's MDD (major depressive disorder), recurrent, severe, with psychosis (HCC) in order to reduce the risk of self-harm at discharge.   5. Discharge Planning:  -- Social work and case management to assist with discharge planning and identification of hospital follow-up needs prior to discharge -- Estimated LOS: 5-7 days (dependent on SI) -- Discharge Concerns: Need to establish a safety plan; Medication compliance and effectiveness -- Discharge Goals: Return home with outpatient referrals for mental health follow-up including medication management/psychotherapy  I certify that inpatient services furnished can reasonably be expected to improve the patient's condition.    Ambreen Tufte 10/16/2019, MD,  04/01/2022, 11:00 AM

## 2022-04-01 NOTE — Group Note (Signed)
[facility] LCSW Group Therapy Note  Date/Time:  04/01/2022  11:00AM-12:00PM  Type of Therapy and Topic:  Group Therapy:  Music and Mood  Participation Level:  Minimal   Description of Group: In this process group, members listened to a variety of genres of music and identified that different types of music evoke different responses.  Patients were encouraged to identify music that was soothing for them and music that was energizing for them.  Patients discussed how this knowledge can help with wellness and recovery in various ways including managing depression and anxiety as well as encouraging healthy sleep habits.    Therapeutic Goals: Patients will explore the impact of different varieties of music on mood Patients will verbalize the thoughts they have when listening to different types of music Patients will identify music that is soothing to them as well as music that is energizing to them Patients will discuss how to use this knowledge to assist in maintaining wellness and recovery Patients will explore the use of music as a coping skill  Summary of Patient Progress:  The patient participated well in group and agreed with the premise set forth.  He enjoyed much of the music.  He was engaged and fully present.  Therapeutic Modalities: Solution Focused Brief Therapy Activity   Selmer Dominion, LCSW

## 2022-04-02 ENCOUNTER — Encounter (HOSPITAL_COMMUNITY): Payer: Self-pay

## 2022-04-02 NOTE — Progress Notes (Signed)
   04/01/22 2200  Psych Admission Type (Psych Patients Only)  Admission Status Involuntary  Psychosocial Assessment  Patient Complaints Anxiety  Eye Contact Fair  Facial Expression Animated  Affect Flat  Speech Logical/coherent  Interaction Minimal;Assertive  Motor Activity Fidgety  Appearance/Hygiene Unremarkable  Behavior Characteristics Cooperative  Mood Anxious  Thought Process  Coherency WDL  Content WDL  Delusions None reported or observed  Perception WDL  Hallucination None reported or observed  Judgment Limited  Confusion WDL  Danger to Self  Current suicidal ideation? Denies  Self-Injurious Behavior No self-injurious ideation or behavior indicators observed or expressed   Agreement Not to Harm Self Yes  Description of Agreement verbal  Danger to Others  Danger to Others None reported or observed

## 2022-04-02 NOTE — Progress Notes (Signed)
Adult Psychoeducational Group Note  Date:  04/02/2022 Time:  8:26 PM  Group Topic/Focus:  Wrap-Up Group:   The focus of this group is to help patients review their daily goal of treatment and discuss progress on daily workbooks.  Participation Level:  Active  Participation Quality:  Appropriate  Affect:  Appropriate  Cognitive:  Appropriate  Insight: Appropriate  Engagement in Group:  Engaged  Modes of Intervention:  Discussion  Additional Comments:   Pt attended and participated in the [place] group. Pt rated his day 8/10. Pt reports some progress toward his goal of  discharge from treatment. Pt reports opening up more with staff, attending groups, and taking his medication daily to prepare for discharge. Pt reports using the following coping skills; counting, reading, prayer, and focusing on the positive in his life to improve his mood and daily coping.  Roy Dudley [name] 04/02/2022, 8:26 PM

## 2022-04-02 NOTE — BH IP Treatment Plan (Signed)
Interdisciplinary Treatment and Diagnostic Plan Update  04/02/2022 Time of Session: 0830 Roy Dudley MRN: [id number]  Principal Diagnosis: MDD (major depressive disorder), recurrent, severe, with psychosis ([place])  Secondary Diagnoses: Principal Problem:   MDD (major depressive disorder), recurrent, with psychotic features Active Problems:   PTSD (post-traumatic stress disorder)   Generalized anxiety disorder   Cannabis use disorder   Current Medications:  Current Facility-Administered Medications  Medication Dose Route Frequency Provider Last Rate Last Admin   acetaminophen (TYLENOL) tablet 650 mg  650 mg Oral Q6H PRN Derrill Center, NP   650 mg at 04/01/22 1339   alum & mag hydroxide-simeth (MAALOX/MYLANTA) 200-200-20 MG/5ML suspension 30 mL  30 mL Oral Q4H PRN Derrill Center, NP       haloperidol (HALDOL) tablet 5 mg  5 mg Oral TID PRN Janine Limbo, MD   5 mg at 04/01/22 2001   And   LORazepam (ATIVAN) tablet 2 mg  2 mg Oral TID PRN Janine Limbo, MD   2 mg at 04/01/22 2002   And   diphenhydrAMINE (BENADRYL) capsule 50 mg  50 mg Oral TID PRN Janine Limbo, MD   50 mg at 04/01/22 2001   haloperidol lactate (HALDOL) injection 5 mg  5 mg Intramuscular TID PRN Janine Limbo, MD       And   LORazepam (ATIVAN) injection 2 mg  2 mg Intramuscular TID PRN Massengill, Ovid Curd, MD       And   diphenhydrAMINE (BENADRYL) injection 50 mg  50 mg Intramuscular TID PRN Massengill, Ovid Curd, MD       escitalopram (LEXAPRO) tablet 20 mg  20 mg Oral Daily Rolanda Lundborg, MD   20 mg at 04/01/22 1131   hydrOXYzine (ATARAX) tablet 25 mg  25 mg Oral Q6H PRN Camelia Phenes, MD   25 mg at 04/01/22 2001   influenza vac split quadrivalent PF (FLUARIX) injection 0.5 mL  0.5 mL Intramuscular Tomorrow-1000 Attiah, Nadir, MD       magnesium hydroxide (MILK OF MAGNESIA) suspension 30 mL  30 mL Oral Daily PRN Derrill Center, NP       nicotine polacrilex (NICORETTE) gum 2 mg  2 mg Oral Q6H PRN  Camelia Phenes, MD   2 mg at 04/01/22 2002   OLANZapine (ZYPREXA) tablet 15 mg  15 mg Oral QHS Attiah, Nadir, MD   15 mg at 04/01/22 2026   OLANZapine (ZYPREXA) tablet 5 mg  5 mg Oral Q6H PRN Winfred Leeds, Nadir, MD   5 mg at 03/30/22 1646   OLANZapine (ZYPREXA) tablet 5 mg  5 mg Oral Daily Attiah, Nadir, MD   5 mg at 04/01/22 1130   pneumococcal 20-valent conjugate vaccine (PREVNAR 20) injection 0.5 mL  0.5 mL Intramuscular Tomorrow-1000 Attiah, Nadir, MD       prazosin (MINIPRESS) capsule 1 mg  1 mg Oral QHS Camelia Phenes, MD   1 mg at 04/01/22 2001   traZODone (DESYREL) tablet 100 mg  100 mg Oral QHS PRN Dian Situ, MD   100 mg at 04/01/22 2001   PTA Medications: Medications Prior to Admission  Medication Sig Dispense Refill Last Dose   escitalopram (LEXAPRO) 20 MG tablet Take 1 tablet (20 mg total) by mouth at bedtime. (Patient not taking: Reported on 03/22/2022) 30 tablet 0    hydrOXYzine (ATARAX/VISTARIL) 50 MG tablet Take 1 tablet (50 mg total) by mouth at bedtime. (Patient not taking: Reported on 01/04/2021) 30 tablet 0    prazosin (MINIPRESS) 1 MG capsule Take  3 capsules (3 mg total) by mouth at bedtime. (Patient not taking: Reported on 01/04/2021) 30 capsule 0     Patient Stressors: Financial difficulties    Patient Strengths: Supportive family/friends  Work skills   Treatment Modalities: Medication Management, Group therapy, Case management,  1 to 1 session with clinician, Psychoeducation, Recreational therapy.   Physician Treatment Plan for Primary Diagnosis: MDD (major depressive disorder), recurrent, severe, with psychosis (HCC) Long Term Goal(s):     Short Term Goals: Ability to identify changes in lifestyle to reduce recurrence of condition will improve Ability to verbalize feelings will improve Ability to disclose and discuss suicidal ideas Ability to identify and develop effective coping behaviors will improve Ability to maintain clinical measurements within normal limits will  improve Ability to identify triggers associated with substance abuse/mental health issues will improve  Medication Management: Evaluate patient's response, side effects, and tolerance of medication regimen.  Therapeutic Interventions: 1 to 1 sessions, Unit Group sessions and Medication administration.  Evaluation of Outcomes: Progressing  Physician Treatment Plan for Secondary Diagnosis: Principal Problem:   MDD (major depressive disorder), recurrent, with psychotic features Active Problems:   PTSD (post-traumatic stress disorder)   Generalized anxiety disorder   Cannabis use disorder  Long Term Goal(s):     Short Term Goals: Ability to identify changes in lifestyle to reduce recurrence of condition will improve Ability to verbalize feelings will improve Ability to disclose and discuss suicidal ideas Ability to identify and develop effective coping behaviors will improve Ability to maintain clinical measurements within normal limits will improve Ability to identify triggers associated with substance abuse/mental health issues will improve     Medication Management: Evaluate patient's response, side effects, and tolerance of medication regimen.  Therapeutic Interventions: 1 to 1 sessions, Unit Group sessions and Medication administration.  Evaluation of Outcomes: Progressing   RN Treatment Plan for Primary Diagnosis: MDD (major depressive disorder), recurrent, severe, with psychosis (HCC) Long Term Goal(s): Knowledge of disease and therapeutic regimen to maintain health will improve  Short Term Goals: Ability to remain free from injury will improve, Ability to verbalize frustration and anger appropriately will improve, Ability to demonstrate self-control, Ability to participate in decision making will improve, Ability to verbalize feelings will improve, Ability to disclose and discuss suicidal ideas, Ability to identify and develop effective coping behaviors will improve, and  Compliance with prescribed medications will improve  Medication Management: RN will administer medications as ordered by provider, will assess and evaluate patient's response and provide education to patient for prescribed medication. RN will report any adverse and/or side effects to prescribing provider.  Therapeutic Interventions: 1 on 1 counseling sessions, Psychoeducation, Medication administration, Evaluate responses to treatment, Monitor vital signs and CBGs as ordered, Perform/monitor CIWA, COWS, AIMS and Fall Risk screenings as ordered, Perform wound care treatments as ordered.  Evaluation of Outcomes: Progressing   LCSW Treatment Plan for Primary Diagnosis: MDD (major depressive disorder), recurrent, severe, with psychosis (HCC) Long Term Goal(s): Safe transition to appropriate next level of care at discharge, Engage patient in therapeutic group addressing interpersonal concerns.  Short Term Goals: Engage patient in aftercare planning with referrals and resources, Increase social support, Increase ability to appropriately verbalize feelings, Increase emotional regulation, Facilitate acceptance of mental health diagnosis and concerns, Facilitate patient progression through stages of change regarding substance use diagnoses and concerns, Identify triggers associated with mental health/substance abuse issues, and Increase skills for wellness and recovery  Therapeutic Interventions: Assess for all discharge needs, 1 to 1 time with Social  worker, Explore available resources and support systems, Assess for adequacy in community support network, Educate family and significant other(s) on suicide prevention, Complete Psychosocial Assessment, Interpersonal group therapy.  Evaluation of Outcomes: Progressing   Progress in Treatment: Attending groups: Yes. Participating in groups: Yes. Taking medication as prescribed: Yes. Toleration medication: Yes. Family/Significant other contact made: Yes,  individual(s) contacted:  Lucious Zou (mother) [phone] Patient understands diagnosis: No. Discussing patient identified problems/goals with staff: Yes. Medical problems stabilized or resolved: Yes. Denies suicidal/homicidal ideation: No. Issues/concerns per patient self-inventory: Yes. Other: none  New problem(s) identified: No, Describe:  none  New Short Term/Long Term Goal(s): Patient to work towards elimination of symptoms of psychosis, medication management for mood stabilization; development of comprehensive mental wellness plan.  Patient Goals:  No additional goals identified at this time. Patient to continue to work towards original goals identified in initial treatment team meeting. CSW will remain available to patient should they voice additional treatment goals.   Discharge Plan or Barriers: No psychosocial barriers identified at this time, patient to return to place of residence when appropriate for discharge.   Reason for Continuation of Hospitalization: Medication stabilization Other; describe psychosis   Estimated Length of Stay: 1-7 days  Last 3 [place] Suicide Severity Risk Score: Flowsheet Row Ad[facility] (Current) [facility] from 03/23/2022 in [facility] ED from 03/21/2022 in [facility]  C-SSRS RISK CATEGORY No Risk High Risk No Risk       Last PHQ 2/9 Scores:    01/04/2021   11:06 AM  Depression screen PHQ 2/9  Decreased Interest 1  Down, Depressed, Hopeless 2  PHQ - 2 Score 3  Altered sleeping 0  Tired, decreased energy 0  Feeling bad or failure about yourself  2  Trouble concentrating 0  Moving slowly or fidgety/restless 0  Suicidal thoughts 1  PHQ-9 Score 6  Difficult doing work/chores Somewhat difficult    Scribe for Treatment Team: Almedia Balls 04/02/2022 10:31 AM

## 2022-04-02 NOTE — Progress Notes (Signed)
[facility] MD Resident Progress Note  04/02/2022 11:13 AM Roy Dudley  MRN:  [id number] Principal Problem: MDD (major depressive disorder), recurrent, severe, with psychosis (HCC) Diagnosis: Principal Problem:   MDD (major depressive disorder), recurrent, with psychotic features Active Problems:   PTSD (post-traumatic stress disorder)   Generalized anxiety disorder   Cannabis use disorder Rule out, schizophrenia paranoid type  Reason for admission   Roy Dudley is a 21 y.o., male with a medical history significant for Y-chromosome microdeletion, learning disability, scaphoencephaly and past psychiatric history significant for ADHD, PTSD, ODD who presents to the [facility] In[facility]untary from [facility] Emergency Department for evaluation and management of suicide attempt, and "mental breakdown." (admitted on 03/26/2022, total  LOS: 7 days )  Chart review from last 24 hours   The patient's chart was reviewed and nursing notes were reviewed. The patient's case was discussed in multidisciplinary team meeting.  - Overnight events per chart review: Per staff no agitation reported withdrawn at times but reportedly got anxious last night and received as needed Atarax and Ativan. Compliant with scheduled medications no issues reported  Information obtained during interview   The patient was seen and evaluated in his room, he is primarily lying down in bed asleep but easily awakened sitting up in bed answering questions in linear manner with no disorganized thought process noted.  He reports slept well last night reports improvement in regard to auditory and visual hallucinations, notes seeing demons is down any scale to 06/1008 being the worst, notes improved hearing voices less frequent and less intense, less loud with no further commands hallucination noted, scaling auditory hallucinations 3 out of 10.  He denies any passive or active SI intention or plan and continues to be able to contract for  safety in the hospital.  When asked he reports still feeling depressed and hopeless mainly triggered by legal problems and going to jail otherwise he notes "I would not be depressed if I am going straight home" when asking him specifically today if he has suicidal ideation to harm himself if he goes to jail he responds "maybe I do not know" which is considerably an improvement given previously he would be always determined that he would hang himself.  I discussed with patient today regarding his stress about going to jail and fear from being in jail with "criminals rapists and killers" per his report.  Discussed with patient that he still has a right for safety even in jail and encouraged him to discuss his concern about safety with officers when they get up from hospital and with his lawyer to ensure his safety wherever he goes, he sounded understanding.  With further discussion today patient notes that after he gets out of jail and goes back home he is hopeful to be able to get a job and "be a good father my girlfriend is pregnant" he presents with better eye contact and more fluent speech in general.  Reportedly patient was a sleepy after morning medication dose of Zyprexa but otherwise attending groups during daytime with no issues to be reported except in 1 group was easily distracted and hard to redirect..    Review of Systems  10 other systems reviewed negative  Objective   Blood pressure 129/80, pulse 74, temperature (!) 97.5 F (36.4 C), temperature source Oral, resp. rate 18, height 6\' 1"  (1.854 m), weight 65.8 kg, SpO2 100 %. Body mass index is 19.13 kg/m. Sleep: 5.5 hours Current Medications: Current Facility-Administered Medications  Medication  Dose Route Frequency Provider Last Rate Last Admin   acetaminophen (TYLENOL) tablet 650 mg  650 mg Oral Q6H PRN Oneta Rack, NP   650 mg at 04/01/22 1339   alum & mag hydroxide-simeth (MAALOX/MYLANTA) 200-200-20 MG/5ML suspension 30 mL  30 mL  Oral Q4H PRN Oneta Rack, NP       haloperidol (HALDOL) tablet 5 mg  5 mg Oral TID PRN Phineas Inches, MD   5 mg at 04/01/22 2001   And   LORazepam (ATIVAN) tablet 2 mg  2 mg Oral TID PRN Phineas Inches, MD   2 mg at 04/01/22 2002   And   diphenhydrAMINE (BENADRYL) capsule 50 mg  50 mg Oral TID PRN Phineas Inches, MD   50 mg at 04/01/22 2001   haloperidol lactate (HALDOL) injection 5 mg  5 mg Intramuscular TID PRN Phineas Inches, MD       And   LORazepam (ATIVAN) injection 2 mg  2 mg Intramuscular TID PRN Massengill, Harrold Donath, MD       And   diphenhydrAMINE (BENADRYL) injection 50 mg  50 mg Intramuscular TID PRN Massengill, Harrold Donath, MD       escitalopram (LEXAPRO) tablet 20 mg  20 mg Oral Daily Karie Fetch, MD   20 mg at 04/02/22 1101   hydrOXYzine (ATARAX) tablet 25 mg  25 mg Oral Q6H PRN Augusto Gamble, MD   25 mg at 04/01/22 2001   influenza vac split quadrivalent PF (FLUARIX) injection 0.5 mL  0.5 mL Intramuscular Tomorrow-1000 Axelle Szwed, MD       magnesium hydroxide (MILK OF MAGNESIA) suspension 30 mL  30 mL Oral Daily PRN Oneta Rack, NP       nicotine polacrilex (NICORETTE) gum 2 mg  2 mg Oral Q6H PRN Augusto Gamble, MD   2 mg at 04/01/22 2002   OLANZapine (ZYPREXA) tablet 15 mg  15 mg Oral QHS Bhavik Cabiness, MD   15 mg at 04/01/22 2026   OLANZapine (ZYPREXA) tablet 5 mg  5 mg Oral Q6H PRN Abbott Pao, Yarrow Linhart, MD   5 mg at 03/30/22 1646   OLANZapine (ZYPREXA) tablet 5 mg  5 mg Oral Daily Armilda Vanderlinden, MD   5 mg at 04/02/22 1100   pneumococcal 20-valent conjugate vaccine (PREVNAR 20) injection 0.5 mL  0.5 mL Intramuscular Tomorrow-1000 Jeremy Ditullio, MD       prazosin (MINIPRESS) capsule 1 mg  1 mg Oral QHS Augusto Gamble, MD   1 mg at 04/01/22 2001   traZODone (DESYREL) tablet 100 mg  100 mg Oral QHS PRN Sarita Bottom, MD   100 mg at 04/01/22 2001   Labs: pending GC/Chlamydia   Physical Exam Constitutional:      Appearance: the patient is not toxic-appearing.   Pulmonary:     Effort: Pulmonary effort is normal.  Neurological:     General: No focal deficit present.     Mental Status: the patient is alert and oriented to person, place, and time.   AIMS: Facial and Oral Movements Muscles of Facial Expression: None, normal Lips and Perioral Area: Occasional Jaw: None, normal Tongue: None, normal,Extremity Movements Upper (arms, wrists, hands, fingers): - Lower (legs, knees, ankles, toes): - Trunk Movements: - Neck, shoulders, hips: -  Overall Severity Severity of abnormal movements (highest score from questions above): None, normal Incapacitation due to abnormal movements: None, normal Patient's awareness of abnormal movements (rate only patient's report): No Awareness, Dental Status Current problems with teeth and/or dentures?: No Does patient  usually wear dentures?: No   No stiffness or cogwheeling noted on exam.   Mental Status Exam   Appearance and Grooming: Fairly dressed and groomed Motor activity: No psychomotor agitation or retardation noted Behavior: Calm Attitude: Cooperative, easily engages in conversation Speech: Fair amount, fair tone and volume Mood: Dysphoric but less in general Affect: Restricted affect ------------------------------------------------------------------------------------------------------------------------- Thought Content Denies SI or HI, reporting improved auditory hallucinations hearing voices but less loud and less intense, denies command hallucinations, reporting improved visual hallucinations seeing demons less vivid and less intense Thought Process Linear and goal directed yet concrete Insight Improved yet limited Judgement Improved yet limited  Assets: Communication Skills; Financial Resources/Insurance; Social Support  Treatment Plan Summary: Daily contact with patient to assess and evaluate symptoms and progress in treatment and Medication management Summary   Roy Dudley is a  21 y.o., male with a medical history significant for Y-chromosome microdeletion, learning disability, scaphoencephaly and past psychiatric history significant for ADHD, PTSD, ODD who presents to the [facility] Involuntary from [facility] Emergency Department for evaluation and management of suicide attempt, and "mental breakdown", current diagnosis is MDD with psychotic features given his hx of MDD prior to admission. However, concern for paranoid schizophrenia given patient's family history and ongoing delusions and hallucinations.    Diagnoses / Active Problems: MDD (major depressive disorder), recurrent, severe, with psychosis ([place]) Principal Problem:   MDD (major depressive disorder), recurrent, with psychotic features Active Problems:   PTSD (post-traumatic stress disorder)   Generalized anxiety disorder   Cannabis use disorder  Plan  Safety and Monitoring: -- Involuntary admission to inpatient psychiatric unit for safety, stabilization and treatment -- Daily contact with patient to assess and evaluate symptoms and progress in treatment -- Patient's case to be discussed in multi-disciplinary team meeting -- Observation Level : Every 15 min given patient interacting appropriately with staff, reporting forward thinking, decreased psychosis. -- Vital signs:  q12 hours -- Precautions: suicide, elopement, and assault  2. Medications:  #MDD with psychosis (r/o paranoid schizophrenia) -CONTINUE 20mg  lexapro for depression Continue Zyprexa 5 mg in the morning and 15 mg at bedtime to help further with psychosis, monitor efficacy and safety Continue trazodone 100 mg at bedtime as needed for sleep and monitor efficacy and safety   #PTSD Home prazosin was continued.    #GAD Continue hydroxyzine 25mg  PRN for anxiety.   #Cannabis use disorder He uses cannabis regularly.  Patient was counseled regarding need to abstain from cannabis use after discharge, he agrees   #Legal  issues He attempted suicide via hand gun, denies it is suicide attempt but is not allowed access to guns given he is on probation. Cops are pressing charges on him, might go to jail. He verbalized to mom that he would hang himself if he were to go to jail. Per social work, he will go to jail on discharge.    #Tobacco use  -- Nicotine polacrilex (gum) ordered, Nicotine patch 14mg /24 hours ordered, and Smoking cessation encouraged   Agitation protocol: as needed oral haloperidol 5 mg, and lorazepam 2 mg, and diphenhydramine 50 mg with backup IM of similar meds  The risks/benefits/side-effects/alternatives to the above medication were discussed in detail with the patient and time was given for questions. The patient consents to medication trial. FDA black box warnings, if present, were discussed.  The patient is agreeable with the medication plan, as above. We will monitor the patient's response to pharmacologic treatment, and adjust medications as necessary.  3. Routine and  other pertinent labs: EKG monitoring: QTc: 361 (10/28)  Lab Results:     Latest Ref Rng & Units 03/23/2022    8:15 PM 03/21/2022    8:09 PM 01/03/2021    8:28 PM  CBC  WBC 4.0 - 10.5 K/uL 6.7  8.0  4.7   Hemoglobin 13.0 - 17.0 g/dL 62.2  29.7  98.9   Hematocrit 39.0 - 52.0 % 42.5  42.4  42.2   Platelets 150 - 400 K/uL 257  228  259       Latest Ref Rng & Units 03/23/2022    8:15 PM 03/21/2022    8:09 PM 01/03/2021    8:28 PM  BMP  Glucose 70 - 99 mg/dL 86  79  211   BUN 6 - 20 mg/dL 10  13  11    Creatinine 0.61 - 1.24 mg/dL  9.41  7.40   Sodium 135 - 145 mmol/L 143  141  138   Potassium 3.5 - 5.1 mmol/L 3.5  3.7  4.2   Chloride 98 - 111 mmol/L 107  107  103   CO2 22 - 32 mmol/L 29  23  27    Calcium 8.9 - 10.3 mg/dL 9.6  9.6  9.6    Blood Alcohol level:  Lab Results  Component Value Date   ETH <10 03/23/2022   ETH <10 03/21/2022   Prolactin: Lab Results  Component Value Date   PROLACTIN 14.9  03/27/2022   PROLACTIN 4.2 07/18/2019   Lipid Panel: Lab Results  Component Value Date   CHOL 119 03/27/2022   TRIG 40 03/27/2022   HDL 34 (L) 03/27/2022   CHOLHDL 3.5 03/27/2022   VLDL 8 03/27/2022   LDLCALC 77 03/27/2022   LDLCALC 95 10/14/2019   HbgA1c: Lab Results  Component Value Date   HGBA1C 5.7 (H) 03/27/2022   TSH: Lab Results  Component Value Date   TSH 0.547 10/14/2019    4. Group Therapy: -- Encouraged patient to participate in unit milieu and in scheduled group therapies  -- Short Term Goals: Ability to identify changes in lifestyle to reduce recurrence of condition will improve, Ability to verbalize feelings will improve, Ability to disclose and discuss suicidal ideas, Ability to identify and develop effective coping behaviors will improve, Ability to maintain clinical measurements within normal limits will improve, and Ability to identify triggers associated with substance abuse/mental health issues will improve -- Long Term Goals: Improvement in symptoms so as ready for discharge -- Patient is encouraged to participate in group therapy while admitted to the psychiatric unit. -- We will address other chronic and acute stressors, which contributed to the patient's MDD (major depressive disorder), recurrent, severe, with psychosis (HCC) in order to reduce the risk of self-harm at discharge.   5. Discharge Planning:  -- Social work and case management to assist with discharge planning and identification of hospital follow-up needs prior to discharge -- Estimated LOS: 5-7 days (dependent on SI) -- Discharge Concerns: Need to establish a safety plan; Medication compliance and effectiveness -- Discharge Goals: Return home with outpatient referrals for mental health follow-up including medication management/psychotherapy  I certify that inpatient services furnished can reasonably be expected to improve the patient's condition.    Roy Dudley 03/29/2022, MD,  04/02/2022, 11:13 AM

## 2022-04-02 NOTE — Group Note (Signed)
Recreation Therapy Group Note   Group Topic:Communication  Group Date: 04/02/2022 Start Time: 1000 End Time: 1035 Facilitators: Brinly Maietta-McCall, LRT,CTRS Location: [address]   Goal Area(s) Addresses:  Patient will effectively listen to complete activity.  Patient will identify communication skills used to make activity successful.  Patient will identify how skills used during activity can be used to reach post d/c goals.    Group Description:  Geometric Drawings.  Three volunteers from the peer group will be shown an abstract picture with a particular arrangement of geometrical shapes.  Each round, one 'speaker' will describe the pattern, as accurately as possible without revealing the image to the group.  The remaining group members will listen and draw the picture to reflect how it is described to them. Patients with the role of 'listener' cannot ask clarifying questions but, may request that the speaker repeat a direction. Once the drawings are complete, the presenter will show the rest of the group the picture and compare how close each person came to drawing the picture. LRT will facilitate a post-activity discussion regarding effective communication and the importance of planning, listening, and asking for clarification in daily interactions with others.   Affect/Mood: N/A   Participation Level: Did not attend    Clinical Observations/Individualized Feedback:     Plan: Continue to engage patient in RT group sessions 2-3x/week.   Mandy Peeks-McCall, LRT,CTRS 04/02/2022 12:23 PM

## 2022-04-02 NOTE — Progress Notes (Signed)
   04/02/22 1200  Psych Admission Type (Psych Patients Only)  Admission Status Other (Comment)  Psychosocial Assessment  Patient Complaints Anxiety  Eye Contact Fair  Facial Expression Animated  Affect Blunted  Speech Logical/coherent  Interaction Assertive  Motor Activity Fidgety  Appearance/Hygiene Unremarkable  Behavior Characteristics Cooperative  Mood Anxious  Thought Process  Coherency WDL  Content WDL  Delusions None reported or observed  Perception WDL  Hallucination None reported or observed  Judgment Impaired  Confusion None  Danger to Self  Current suicidal ideation? Denies  Danger to Others  Danger to Others None reported or observed   Dar Note: Patient presents with anxious affect and mood.  Denies suicidal thoughts, auditory and visual hallucinations.  Patient appears restless and fidgety.  Preoccupied and concern about discharge date.  Routine safety checks maintained.  Patient is safe on and off the unit.

## 2022-04-02 NOTE — Group Note (Signed)
[facility] LCSW Group Therapy Note    Group Date: 04/02/2022 Start Time: 1300 End Time: 1400  Type of Therapy and Topic:  Group Therapy:  Overcoming Obstacles  Participation Level:  [facility] PARTICIPATION LEVEL: Active    Description of Group:   In this group patients will be encouraged to explore what they see as obstacles to their own wellness and recovery. They will be guided to discuss their thoughts, feelings, and behaviors related to these obstacles. The group will process together ways to cope with barriers, with attention given to specific choices patients can make. Each patient will be challenged to identify changes they are motivated to make in order to overcome their obstacles. This group will be process-oriented, with patients participating in exploration of their own experiences as well as giving and receiving support and challenge from other group members.  Therapeutic Goals: 1. Patient will identify personal and current obstacles as they relate to admission. 2. Patient will identify barriers that currently interfere with their wellness or overcoming obstacles.  3. Patient will identify feelings, thought process and behaviors related to these barriers. 4. Patient will identify two changes they are willing to make to overcome these obstacles:    Summary of Patient Progress   Patient was an active participate but was very inappropriate. Patient was hard to redirect. Patient insight was poor. Patient was talking over other peers and using lots of profanity.   Therapeutic Modalities:   Cognitive Behavioral Therapy Solution Focused Therapy Motivational Interviewing Relapse Prevention Therapy   Sherre Lain, LCSWA

## 2022-04-03 NOTE — Group Note (Signed)
Recreation Therapy Group Note   Group Topic:Self-Esteem  Group Date: 04/03/2022 Start Time: 1000 End Time: 8527 Facilitators: Aamori Mcmasters-McCall, LRT,CTRS Location: [address] Dayroom   Goal Area(s) Addresses:  Patient will successfully identify positive attributes about themselves.  Patient will identify healthy ways to increase self-esteem. Patient will acknowledge benefit(s) of improved self-esteem.   Group Description:  Wellsite geologist.  LRT and patients discussed how the views of others impact how we feel about ourselves.  Patients also discussed the importance of having positive thoughts about one's self.  LRT then gave patients a blank outline of a picture frame.  Patients were to take the picture frame and create a picture of themselves using positive images and language. In addition, to the blank picture frames, patients were given markers to create their vision.  LRT also played music for patients as they worked on [employer]   Affect/Mood: Appropriate   Participation Level: Engaged   Participation Quality: Independent   Behavior: Appropriate   Speech/Thought Process: Focused   Insight: Good   Judgement: Good   Modes of Intervention: Art and Music   Patient Response to Interventions:  Engaged   Education Outcome:  Acknowledges education and In group clarification offered    Clinical Observations/Individualized Feedback: Pt was appropriate and engaged during group session.  Pt drew a picture of himself standing in the drive way of his home and he would get smaller as he enters the house.  Pt expressed wanting to get a big house, money, Teacher, English as a foreign language with expensive rims.  Pt also said his religion is important and his picture showed him with money in both pockets.    Plan: Continue to engage patient in RT group sessions 2-3x/week.   Chanta Bauers-McCall, LRT,CTRS  04/03/2022 12:15 PM

## 2022-04-03 NOTE — Progress Notes (Signed)
   04/03/22 1000  Psych Admission Type (Psych Patients Only)  Admission Status Involuntary  Psychosocial Assessment  Patient Complaints Irritability;Anxiety  Eye Contact Fair  Facial Expression Animated  Affect Blunted  Speech Logical/coherent  Interaction Arrogant;Assertive  Motor Activity Fidgety;Restless  Appearance/Hygiene Unremarkable  Behavior Characteristics Hyperactive;Intrusive  Mood Preoccupied;Labile  Thought Process  Coherency WDL  Content WDL  Delusions None reported or observed  Perception Hallucinations  Hallucination Visual  Judgment Impaired  Confusion None  Danger to Self  Current suicidal ideation? Denies  Danger to Others  Danger to Others None reported or observed

## 2022-04-03 NOTE — Progress Notes (Signed)
D- Patient alert and oriented. Denies SI, HI, AH. 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 Patient preoccupied with discharge, asking when he could "get out of here." Patient rates anxiety at 10/10 and depression as 4/10. Patient also concerned about the length of time he would be on medications. Patient asked multiple times for "perc 5's."  A- Scheduled medications administered to patient, per MAR. PRN tylenol administered for pain and hydroxyzine for anxiety. Medication education provided. Pain control education provided. Support and encouragement provided.  Routine safety checks conducted every 15 minutes.  Patient informed to notify staff with problems or concerns.  R- No adverse drug reactions noted. Patient contracts for safety at this time. Patient compliant with medications and treatment plan. Patient receptive, calm, and cooperative. Patient interacts well with others on the unit.  Patient remains safe at this time.

## 2022-04-03 NOTE — Progress Notes (Signed)
D- Patient alert and oriented. Denies SI, HI, AVH. Patient was heard in his room yelling. When approached and asked if he was ok, patient responded, "I want to get out of here. Everyone here is lying to me. I want to get back home." Patient was asked to refrain from yelling and disrupting other patients. Patient complied at the time and then began yelling again. "Trump is still president. We need Trump back." Patient was once again asked to refrain from disrupting the milieu and this time complied. At medication administration, patient tried to reach over the med window. Patient was asked to back up, which he did until the medication was brought out to the computer. Patient attempted to reach through the window and grab the meds. This RN took the meds away from the patient and instructed him not to reach for the medication until they were handed to him. The patient reported, "I just want to see what I got." Patient was shown and educated on the medication at this time. Before taking the meds, patient lifted them to his nose and said they looked and smelled funny. "These smell like weed." This RN assured the patient that the medication was as prescribed and contained no illicit substances.   A- Scheduled medications administered to patient, along with PRN hydroxyzine and trazodone per [facility] Education provided to patient. Support and encouragement provided.  Routine safety checks conducted every 15 minutes.  Patient informed to notify staff with problems or concerns.  R- No adverse drug reactions noted. Patient contracts for safety at this time. Patient compliant with medications and treatment plan. Patient remains safe at this time.

## 2022-04-03 NOTE — Progress Notes (Signed)
The focus of this group is to help patients review their daily goal of treatment and discuss progress on daily workbooks.  Pt attended the evening group but responded minimally to discussion prompts from the [place]. Pt appeared distracted throughout, walking about the room and making loud and persistent comments, often profane, while his peers tried to speak. Pt responded little to redirection attempts by the Writer.  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

## 2022-04-03 NOTE — Plan of Care (Signed)
  Problem: Education: Goal: Knowledge of the prescribed therapeutic regimen will improve Outcome: Progressing   Problem: Coping: Goal: Will verbalize feelings Outcome: Progressing   Problem: Health Behavior/Discharge Planning: Goal: Compliance with prescribed medication regimen will improve Outcome: Progressing   Problem: Nutritional: Goal: Ability to achieve adequate nutritional intake will improve Outcome: Progressing   Problem: Role Relationship: Goal: Ability to communicate needs accurately will improve Outcome: Progressing Goal: Ability to interact with others will improve Outcome: Progressing

## 2022-04-03 NOTE — Plan of Care (Signed)
  Problem: Education: Goal: Knowledge of the prescribed therapeutic regimen will improve Outcome: Progressing   Problem: Coping: Goal: Will verbalize feelings Outcome: Progressing   Problem: Health Behavior/Discharge Planning: Goal: Compliance with prescribed medication regimen will improve Outcome: Progressing   Problem: Role Relationship: Goal: Ability to interact with others will improve Outcome: Progressing   Problem: Self-Care: Goal: Ability to participate in self-care as condition permits will improve Outcome: Progressing

## 2022-04-03 NOTE — Progress Notes (Signed)
[facility] MD Resident Progress Note  04/03/2022 12:31 PM Roy Dudley  MRN:  [id number] Principal Problem: MDD (major depressive disorder), recurrent, severe, with psychosis (HCC) Diagnosis: Principal Problem:   MDD (major depressive disorder), recurrent, with psychotic features Active Problems:   PTSD (post-traumatic stress disorder)   Generalized anxiety disorder   Cannabis use disorder Rule out, schizophrenia paranoid type  Reason for admission   Roy Dudley is a 21 y.o., male with a medical history significant for Y-chromosome microdeletion, learning disability, scaphoencephaly and past psychiatric history significant for ADHD, PTSD, ODD who presents to the [facility] Involuntary from [facility] for evaluation and management of suicide attempt, and "mental breakdown." (admitted on 03/26/2022, total  LOS: 8 days )  Chart review from last 24 hours   The patient's chart was reviewed and nursing notes were reviewed. The patient's case was discussed in multidisciplinary team meeting.  - Overnight events per chart review: Per staff at times patient was noted to be hitting some on the wall and sounds like shouting.  Last needed Atarax and Ativan was given yesterday morning none since then.  As needed trazodone for sleep was given last night. Compliant with scheduled medications no issues reported  Information obtained during interview   The patient was seen and evaluated in his room 2 times in different occasions today.  Patient reports slept well last night attended groups reports he is learning coping skills to cope with the stressors using breathing techniques and "counting to calm myself" he reports he also uses singing to cope with the stressors.  He denies depressed mood or anxiety and scales depression and anxiety 05/1008 being the worst.  He denies SI intention or plan, he denies HI he continues to be able to contract for safety in the hospital.  He denies  auditory hallucinations and when asked about visual hallucinations seeing demons he reports "it is 2" referring to 0-10 scale, reports significant improvement in visual hallucinations not being vivid or scary last time was vivid or scary was 3 days ago per his report.  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  He tells me that he is hopeful about the jail issue "I spoke with my father I think he will pay my bond".  I spoke to patient's mother over the phone who agrees that patient has improved significantly over the past few days with medications and continues to express concerns that 2 days ago he told her that if he goes to jail he will harm himself.  I discussed with mother and patient that at time of discharge we will recommend patient to have suicide precaution in jail and also discussed with patient to bring up concerns about safety with jail personnel and his lawyer. When discussed with patient regard reports that he is hitting on the wall and screaming he notes "I am mimicking a drum on the wall and singing loudly I know it sounds like a screaming but I am not"  In my opinion patient improved significantly since admission in regard to psychosis and depression, he presents able to discuss  coping skills with the stressors and presents future oriented in his conversation talking about future plans and goals, he continues to deny SI HI or AH and reports significant improvement in [facility] almost back to baseline per his report.  He is compliant with medication with no side effects and agrees to comply after discharge.  At time of discharge to jail will recommend continuing  suicide precautions at jail to ensure safety given patient's earlier statements 2 days ago.  If patient continues to improve and remained stable I plan to discharge tomorrow morning to jail given he has warrant of arrest.  Will inform patient of his discharge plan tomorrow morning when finalized.  Review of Systems  10 other systems reviewed negative  Objective   Blood pressure (!) 143/87, pulse (!) 56, temperature 98.1 F (36.7 C), temperature source Oral, resp. rate 16, height 6\' 1"  (1.854 m), weight 65.8 kg, SpO2 100 %. Body mass index is 19.13 kg/m. Sleep: 5.5 hours Current Medications: Current Facility-Administered Medications  Medication Dose Route Frequency Provider Last Rate Last Admin   acetaminophen (TYLENOL) tablet 650 mg  650 mg Oral Q6H PRN Derrill Center, NP   650 mg at 04/03/22 0811   alum & mag hydroxide-simeth (MAALOX/MYLANTA) 200-200-20 MG/5ML suspension 30 mL  30 mL Oral Q4H PRN Derrill Center, NP       haloperidol (HALDOL) tablet 5 mg  5 mg Oral TID PRN Janine Limbo, MD   5 mg at 04/03/22 7846   And   LORazepam (ATIVAN) tablet 2 mg  2 mg Oral TID PRN Janine Limbo, MD   2 mg at 04/03/22 9629   And   diphenhydrAMINE (BENADRYL) capsule 50 mg  50 mg Oral TID PRN Janine Limbo, MD   50 mg at 04/03/22 5284   haloperidol lactate (HALDOL) injection 5 mg  5 mg Intramuscular TID PRN Janine Limbo, MD       And   LORazepam (ATIVAN) injection 2 mg  2 mg Intramuscular TID PRN Massengill, Ovid Curd, MD       And   diphenhydrAMINE (BENADRYL) injection 50 mg  50 mg Intramuscular TID PRN Massengill, Ovid Curd, MD       escitalopram (LEXAPRO) tablet 20 mg  20 mg Oral Daily Rolanda Lundborg, MD   20 mg at 04/03/22 1324   hydrOXYzine (ATARAX) tablet 25 mg  25 mg Oral Q6H PRN Camelia Phenes, MD   25 mg at 04/03/22 4010   influenza vac split quadrivalent PF (FLUARIX) injection 0.5 mL  0.5 mL Intramuscular Tomorrow-1000 Maykel Reitter, MD       magnesium hydroxide (MILK OF  MAGNESIA) suspension 30 mL  30 mL Oral Daily PRN Derrill Center, NP       nicotine polacrilex (NICORETTE) gum 2 mg  2 mg Oral Q6H PRN Camelia Phenes, MD   2 mg at 04/03/22 0812   OLANZapine (ZYPREXA) tablet 15 mg  15 mg Oral QHS Lenorris Karger, MD   15 mg at 04/02/22 2054   OLANZapine (ZYPREXA) tablet 5 mg  5 mg Oral Q6H PRN Winfred Leeds, Winna Golla, MD   5 mg at 03/30/22 1646   OLANZapine (ZYPREXA) tablet 5 mg  5 mg Oral Daily Kyara Boxer, MD   5 mg at 04/03/22 2725   pneumococcal 20-valent conjugate vaccine (PREVNAR 20) injection 0.5 mL  0.5 mL Intramuscular Tomorrow-1000 Bernadine Melecio, MD       prazosin (MINIPRESS) capsule 1 mg  1 mg Oral QHS Camelia Phenes, MD   1 mg  at 04/02/22 2056   traZODone (DESYREL) tablet 100 mg  100 mg Oral QHS PRN Sarita BottomAttiah, Ewa Hipp, MD   100 mg at 04/02/22 2056   Labs: pending GC/Chlamydia   Physical Exam Constitutional:      Appearance: the patient is not toxic-appearing.  Pulmonary:     Effort: Pulmonary effort is normal.  Neurological:     General: No focal deficit present.     Mental Status: the patient is alert and oriented to person, place, and time.   AIMS: Facial and Oral Movements Muscles of Facial Expression: None, normal Lips and Perioral Area: Occasional Jaw: None, normal Tongue: None, normal,Extremity Movements Upper (arms, wrists, hands, fingers): - Lower (legs, knees, ankles, toes): - Trunk Movements: - Neck, shoulders, hips: -  Overall Severity Severity of abnormal movements (highest score from questions above): None, normal Incapacitation due to abnormal movements: None, normal Patient's awareness of abnormal movements (rate only patient's report): No Awareness, Dental Status Current problems with teeth and/or dentures?: No Does patient usually wear dentures?: No   No stiffness or cogwheeling noted on exam.   Mental Status Exam   Appearance and Grooming: Fairly dressed and groomed Motor activity: No psychomotor agitation or retardation  noted Behavior: Calm Attitude: Cooperative, easily engages in conversation Speech: Fair amount, fair tone and volume Mood: Dysphoric but less in general Affect: Restricted affect ------------------------------------------------------------------------------------------------------------------------- Thought Content Denies SI or HI, denies auditory hallucination or command hallucinations, reporting improved visual hallucinations seeing demons less vivid and less intense Thought Process Linear and goal directed yet concrete Insight Improved, fair understand his mental illness and need to remain on medications after discharge. Judgement Improved, failure, agrees to comply with medication and follow-up appointment after discharge as well as abstaining from marijuana use.  Assets: Manufacturing systems engineerCommunication Skills; Financial Resources/Insurance; Social Support  Treatment Plan Summary: Daily contact with patient to assess and evaluate symptoms and progress in treatment and Medication management Summary   Hicks Dimple CaseyRice is a 21 y.o., male with a medical history significant for Y-chromosome microdeletion, learning disability, scaphoencephaly and past psychiatric history significant for ADHD, PTSD, ODD who [facility] for evaluation and management of suicide attempt, and "mental breakdown", current diagnosis is MDD with psychotic features given his hx of MDD prior to admission. However, concern for paranoid schizophrenia given patient's family history and ongoing delusions and hallucinations.    Diagnoses / Active Problems: MDD (major depressive disorder), recurrent, severe, with psychosis (HCC) Principal Problem:   MDD (major depressive disorder), recurrent, with psychotic features Active Problems:   PTSD (post-traumatic stress disorder)   Generalized anxiety disorder   Cannabis use disorder  Plan  Safety and  Monitoring: -- Involuntary admission to inpatient psychiatric unit for safety, stabilization and treatment -- Daily contact with patient to assess and evaluate symptoms and progress in treatment -- Patient's case to be discussed in multi-disciplinary team meeting -- Observation Level : Every 15 min given patient interacting appropriately with staff, reporting forward thinking, decreased psychosis. -- Vital signs:  q12 hours -- Precautions: suicide, elopement, and assault  2. Medications:  #MDD with psychosis (r/o paranoid schizophrenia) -CONTINUE 20mg  lexapro for depression Continue Zyprexa 5 mg in the morning and 15 mg at bedtime to help further with psychosis, monitor efficacy and safety Continue trazodone 100 mg at bedtime as needed for sleep and monitor efficacy and safety   #PTSD Home prazosin was continued.    #GAD Continue hydroxyzine 25mg  PRN for anxiety.   #Cannabis  use disorder He uses cannabis regularly.  Patient was counseled regarding need to abstain from cannabis use after discharge, he agrees   #Legal issues He attempted suicide via hand gun, denies it is suicide attempt but is not allowed access to guns given he is on probation. Cops are pressing charges on him, might go to jail. He verbalized to mom that he would hang himself if he were to go to jail. Per social work, he will go to jail on discharge.    #Tobacco use  -- Nicotine polacrilex (gum) ordered, Nicotine patch 14mg /24 hours ordered, and Smoking cessation encouraged   Agitation protocol: as needed oral haloperidol 5 mg, and lorazepam 2 mg, and diphenhydramine 50 mg with backup IM of similar meds  The risks/benefits/side-effects/alternatives to the above medication were discussed in detail with the patient and time was given for questions. The patient consents to medication trial. FDA black box warnings, if present, were discussed.  The patient is agreeable with the medication plan, as above. We will monitor  the patient's response to pharmacologic treatment, and adjust medications as necessary.  3. Routine and other pertinent labs: EKG monitoring: QTc: 361 (10/28)  Lab Results:     Latest Ref Rng & Units 03/23/2022    8:15 PM 03/21/2022    8:09 PM 01/03/2021    8:28 PM  CBC  WBC 4.0 - 10.5 K/uL 6.7  8.0  4.7   Hemoglobin 13.0 - 17.0 g/dL 03/05/2021  54.2  70.6   Hematocrit 39.0 - 52.0 % 42.5  42.4  42.2   Platelets 150 - 400 K/uL 257  228  259       Latest Ref Rng & Units 03/23/2022    8:15 PM 03/21/2022    8:09 PM 01/03/2021    8:28 PM  BMP  Glucose 70 - 99 mg/dL 86  79  03/05/2021   BUN 6 - 20 mg/dL 10  13  11    Creatinine 0.61 - 1.24 mg/dL 628   3.15   Sodium 135 - 145 mmol/L 143  141  138   Potassium 3.5 - 5.1 mmol/L 3.5  3.7  4.2   Chloride 98 - 111 mmol/L 107  107  103   CO2 22 - 32 mmol/L 29  23  27    Calcium 8.9 - 10.3 mg/dL 9.6  9.6  9.6    Blood Alcohol level:  Lab Results  Component Value Date   ETH <10 03/23/2022   ETH <10 03/21/2022   Prolactin: Lab Results  Component Value Date   PROLACTIN 14.9 03/27/2022   PROLACTIN 4.2 07/18/2019   Lipid Panel: Lab Results  Component Value Date   CHOL 119 03/27/2022   TRIG 40 03/27/2022   HDL 34 (L) 03/27/2022   CHOLHDL 3.5 03/27/2022   VLDL 8 03/27/2022   LDLCALC 77 03/27/2022   LDLCALC 95 10/14/2019   HbgA1c: Lab Results  Component Value Date   HGBA1C 5.7 (H) 03/27/2022   TSH: Lab Results  Component Value Date   TSH 0.547 10/14/2019    4. Group Therapy: -- Encouraged patient to participate in unit milieu and in scheduled group therapies  -- Short Term Goals: Ability to identify changes in lifestyle to reduce recurrence of condition will improve, Ability to verbalize feelings will improve, Ability to disclose and discuss suicidal ideas, Ability to identify and develop effective coping behaviors will improve, Ability to maintain clinical measurements within normal limits will improve, and Ability to identify  triggers associated with  substance abuse/mental health issues will improve -- Long Term Goals: Improvement in symptoms so as ready for discharge -- Patient is encouraged to participate in group therapy while admitted to the psychiatric unit. -- We will address other chronic and acute stressors, which contributed to the patient's MDD (major depressive disorder), recurrent, severe, with psychosis (HCC) in order to reduce the risk of self-harm at discharge.   5. Discharge Planning:  -- Social work and case management to assist with discharge planning and identification of hospital follow-up needs prior to discharge -- Estimated LOS: 5-7 days (dependent on SI) -- Discharge Concerns: Need to establish a safety plan; Medication compliance and effectiveness -- Discharge Goals: Return home with outpatient referrals for mental health follow-up including medication management/psychotherapy  I certify that inpatient services furnished can reasonably be expected to improve the patient's condition.    Arlander Gillen Abbott Pao, MD,  04/03/2022, 12:31 PM

## 2022-04-04 MED ORDER — OLANZAPINE 15 MG PO TABS: 15.0000 mg | ORAL_TABLET | Freq: Every day | ORAL | 0 refills | Status: DC

## 2022-04-04 MED ORDER — HYDROXYZINE HCL 25 MG PO TABS: 25.0000 mg | ORAL_TABLET | Freq: Four times a day (QID) | ORAL | 0 refills | Status: DC | PRN

## 2022-04-04 MED ORDER — PRAZOSIN HCL 1 MG PO CAPS: 1.0000 mg | ORAL_CAPSULE | Freq: Every day | ORAL | 0 refills | Status: DC

## 2022-04-04 MED ORDER — TRAZODONE HCL 100 MG PO TABS: 100.0000 mg | ORAL_TABLET | Freq: Every evening | ORAL | 0 refills | Status: DC | PRN

## 2022-04-04 MED ORDER — ESCITALOPRAM OXALATE 20 MG PO TABS: 20.0000 mg | ORAL_TABLET | Freq: Every day | ORAL | 0 refills | Status: DC

## 2022-04-04 MED ORDER — OLANZAPINE 5 MG PO TABS: 5.0000 mg | ORAL_TABLET | Freq: Every day | ORAL | 0 refills | Status: DC
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
## 2022-04-04 NOTE — Progress Notes (Signed)
[facility] LCSW Progress Note   04/04/2022    9:54 AM  Roy Dudley   [id number]   Type of Contact and Topic:  Discharge Planning to [facility], communication with officer  CSW spoke with officer that will be picking patient up at discharge.  CSW notified officer about doctors recommendations for suicide watch in jail due to statements made during his treatment.  Officer demonstrated understanding and agreed to pick patient up at 12pm.      Signed:  Anson Oregon MSW, LCSW, LCAS 04/04/2022 9:54 AM

## 2022-04-04 NOTE — Group Note (Signed)
Recreation Therapy Group Note   Group Topic:Team Building  Group Date: 04/04/2022 Start Time: 1000 End Time: 1025 Facilitators: Rodger Giangregorio-McCall, LRT,CTRS Location: [address]   Goal Area(s) Addresses:  Patient will effectively work with peer towards shared goal.  Patient will identify skills used to make activity successful.  Patient will identify how skills used during activity can be used to reach post d/c goals.   Group Description: Landing Pad. In teams of 3-5, patients were given 12 plastic drinking straws and an equal length of masking tape. Using the materials provided, patients were asked to build a landing pad to catch a golf ball dropped from approximately 5 feet in the air. All materials were required to be used by the team in their design. LRT facilitated post-activity discussion.   Affect/Mood: Anxious   Participation Level: Moderate   Participation Quality: Independent   Behavior: Interactive    Speech/Thought Process: Relevant   Insight: Fair   Judgement: Fair    Modes of Intervention: STEM Activity   Patient Response to Interventions:  Engaged   Education Outcome:  Acknowledges education and In group clarification offered    Clinical Observations/Individualized Feedback: Pt was moderate in his participation.  Pt was the leader of his group in coming up with the concept of their landing pad.  Once finished, pt had hard time sitting still while other peers finished.     Plan: Continue to engage patient in RT group sessions 2-3x/week.   Piper Hassebrock-McCall, LRT,CTRS 04/04/2022 11:46 AM

## 2022-04-04 NOTE — Plan of Care (Signed)
  Problem: Activity: Goal: Will verbalize the importance of balancing activity with adequate rest periods Outcome: Progressing   Problem: Education: Goal: Will be free of psychotic symptoms Outcome: Progressing Goal: Knowledge of the prescribed therapeutic regimen will improve Outcome: Progressing   Problem: Coping: Goal: Coping ability will improve Outcome: Progressing Goal: Will verbalize feelings Outcome: Progressing   Problem: Health Behavior/Discharge Planning: Goal: Compliance with prescribed medication regimen will improve Outcome: Progressing   Problem: Nutritional: Goal: Ability to achieve adequate nutritional intake will improve Outcome: Progressing   Problem: Role Relationship: Goal: Ability to communicate needs accurately will improve Outcome: Progressing Goal: Ability to interact with others will improve Outcome: Progressing   Problem: Safety: Goal: Ability to redirect hostility and anger into socially appropriate behaviors will improve Outcome: Progressing Goal: Ability to remain free from injury will improve Outcome: Progressing   Problem: Self-Care: Goal: Ability to participate in self-care as condition permits will improve Outcome: Progressing   Problem: Self-Concept: Goal: Will verbalize positive feelings about self Outcome: Progressing   

## 2022-04-04 NOTE — Progress Notes (Signed)
  [facility] Adult Case Management Discharge Plan :  Will you be returning to the same living situation after discharge:  No. At discharge, do you have transportation home?: Yes,  police will be picking patient up Do you have the ability to pay for your medications: Yes,  insurance  Release of information consent forms completed and in the chart;  [name] needed at discharge.  Patient to Follow up at:  Follow-up Information     [facility]. Go to.   Specialty: Behavioral Health Why: You may also go to this provider to schedule an assessment, to obtain therapy and medication management services; on a Monday or a Wednesday morning, arrive by 7:30 am.  Services are provided on a first come, first served basis.  Once you have scheduled and completed your assessment, appointments will be scheduled for therapy and medication management. Contact information: [address] [place] [address] [phone]        [facility] Follow up.   Why: You may call this provider to schedule an appointment for medication management and/or therapy services. Contact information: [address] [place] [address] [phone]         [facility]. Call.   Specialty: Behavioral Health Why: You may call this provider for information on ther partial hospitalization program for intensive therapy services, and medication management while in the program. Contact information: [address] [place] [address] [phone]                Next level of care provider has access to [facility] Link:yes  Safety Planning and Suicide Prevention discussed: Yes,  mother and speaking with the officer that will be picking patient up     Has patient been referred to the Quitline?: Patient refused referral  Patient has been referred for addiction treatment: Yes- patient can receive treatment at  [facility] for marijuana use  Taequan Stockhausen E Gilberto Stanforth, LCSW 04/04/2022, 9:51 AM

## 2022-04-04 NOTE — Progress Notes (Signed)
Patient was given discharge education. Patient belongings returned. Patient was discharged to custody of law enforcement.

## 2022-04-04 NOTE — Plan of Care (Signed)
  Problem: Activity: Goal: Will verbalize the importance of balancing activity with adequate rest periods 04/04/2022 0904 by Virgel Paling, RN Outcome: Adequate for Discharge 04/04/2022 0903 by Virgel Paling, RN Outcome: Progressing   Problem: Education: Goal: Will be free of psychotic symptoms 04/04/2022 0904 by Virgel Paling, RN Outcome: Adequate for Discharge 04/04/2022 0903 by Virgel Paling, RN Outcome: Progressing Goal: Knowledge of the prescribed therapeutic regimen will improve 04/04/2022 0904 by Virgel Paling, RN Outcome: Adequate for Discharge 04/04/2022 0903 by Virgel Paling, RN Outcome: Progressing   Problem: Coping: Goal: Coping ability will improve 04/04/2022 0904 by Virgel Paling, RN Outcome: Adequate for Discharge 04/04/2022 0903 by Virgel Paling, RN Outcome: Progressing Goal: Will verbalize feelings 04/04/2022 0904 by Virgel Paling, RN Outcome: Adequate for Discharge 04/04/2022 0903 by Virgel Paling, RN Outcome: Progressing   Problem: Health Behavior/Discharge Planning: Goal: Compliance with prescribed medication regimen will improve 04/04/2022 0904 by Virgel Paling, RN Outcome: Adequate for Discharge 04/04/2022 0903 by Virgel Paling, RN Outcome: Progressing   Problem: Nutritional: Goal: Ability to achieve adequate nutritional intake will improve 04/04/2022 0904 by Virgel Paling, RN Outcome: Adequate for Discharge 04/04/2022 0903 by Virgel Paling, RN Outcome: Progressing   Problem: Role Relationship: Goal: Ability to communicate needs accurately will improve 04/04/2022 0904 by Virgel Paling, RN Outcome: Adequate for Discharge 04/04/2022 0903 by Virgel Paling, RN Outcome: Progressing Goal: Ability to interact with others will improve 04/04/2022 0904 by Virgel Paling, RN Outcome: Adequate for Discharge 04/04/2022 0903 by Virgel Paling, RN Outcome: Progressing   Problem: Safety: Goal: Ability  to redirect hostility and anger into socially appropriate behaviors will improve 04/04/2022 0904 by Virgel Paling, RN Outcome: Adequate for Discharge 04/04/2022 0903 by Virgel Paling, RN Outcome: Progressing Goal: Ability to remain free from injury will improve 04/04/2022 0904 by Virgel Paling, RN Outcome: Adequate for Discharge 04/04/2022 0903 by Virgel Paling, RN Outcome: Progressing   Problem: Self-Care: Goal: Ability to participate in self-care as condition permits will improve 04/04/2022 0904 by Virgel Paling, RN Outcome: Adequate for Discharge 04/04/2022 0903 by Virgel Paling, RN Outcome: Progressing   Problem: Self-Concept: Goal: Will verbalize positive feelings about self 04/04/2022 0904 by Virgel Paling, RN Outcome: Adequate for Discharge 04/04/2022 0903 by Virgel Paling, RN Outcome: Progressing
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
## 2022-04-04 NOTE — Progress Notes (Signed)
Patient appears anxious. Patient denies SI/HI/AVH. Pt goal for the day is "to be chill". Patient complied with morning medication with no reported side effects.  Pt reports good sleep and appetite. Pt reports anxiety is 2/10 and depression is a 0/10.  Per treatment team pt is to be d/c today into police custody. Patient remains safe on [employer] checks and contracts for safety.       04/04/22 0900  Psych Admission Type (Psych Patients Only)  Admission Status Involuntary  Psychosocial Assessment  Patient Complaints Anxiety;Irritability  Eye Contact Fair  Facial Expression Animated  Affect Labile  Speech Logical/coherent  Interaction Assertive;Attention-seeking;Intrusive  Motor Activity Restless;Fidgety  Appearance/Hygiene Unremarkable  Behavior Characteristics Hyperactive;Intrusive;Restless  Mood Labile  Thought Process  Coherency WDL  Content WDL  Delusions None reported or observed  Perception WDL  Hallucination None reported or observed  Judgment Impaired  Confusion None  Danger to Self  Current suicidal ideation? Denies  Self-Injurious Behavior No self-injurious ideation or behavior indicators observed or expressed   Agreement Not to Harm Self Yes  Description of Agreement verbal  Danger to Others  Danger to Others None reported or observed

## 2022-09-21 ENCOUNTER — Ambulatory Visit (HOSPITAL_COMMUNITY)
Admission: EM | Admit: 2022-09-21 | Discharge: 2022-09-21 | Disposition: A | Discharge status: Home / Self Care | Payer: Medicaid Other | Attending: Emergency Medicine | Admitting: Emergency Medicine

## 2022-09-21 ENCOUNTER — Encounter (HOSPITAL_COMMUNITY): Payer: Self-pay

## 2022-09-21 DIAGNOSIS — J4521 Mild intermittent asthma with (acute) exacerbation: Secondary | ICD-10-CM

## 2022-09-21 DIAGNOSIS — J069 Acute upper respiratory infection, unspecified: Secondary | ICD-10-CM

## 2022-09-21 MED ORDER — PREDNISONE 10 MG (21) PO TBPK: ORAL_TABLET | Freq: Every day | ORAL | 0 refills | Status: DC

## 2022-09-21 MED ORDER — ALBUTEROL SULFATE HFA 108 (90 BASE) MCG/ACT IN AERS: 1.0000 | INHALATION_SPRAY | Freq: Four times a day (QID) | RESPIRATORY_TRACT | 0 refills | Status: DC | PRN

## 2022-09-21 MED ORDER — GUAIFENESIN ER 600 MG PO TB12: 1200.0000 mg | ORAL_TABLET | Freq: Two times a day (BID) | ORAL | 0 refills | Status: AC

## 2022-09-21 NOTE — Discharge Instructions (Addendum)
I believe you have a viral upper respiratory infection, likely obtained from your daughter.  You can use the Mucinex to break up your secretions, please ensure you are drinking at least 64 ounces of water.  You can use a humidifier, do warm saline gargles, and tea with honey for your sore throat.  You also alternate between Tylenol and ibuprofen every 4-6 hours for pain, fever and discomfort.  You were wheezing on your physical exam, I am going to refill your albuterol inhaler.  Please use 1 to 2 puffs every 6 hours as needed for wheezing or shortness of breath.  I am also going to start you on a steroid Dosepak, start this tomorrow morning with breakfast.    Return to clinic or seek immediate care if you develop worsening of shortness of breath, no improvement in symptoms over the next 7 to 10 days, or any new concerning symptoms.

## 2022-09-21 NOTE — ED Triage Notes (Signed)
Pt presents to the office today. Pt stated he woke up with cough,sore throat and headache.
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
## 2023-03-14 ENCOUNTER — Ambulatory Visit (HOSPITAL_COMMUNITY)
Admission: EM | Admit: 2023-03-14 | Discharge: 2023-03-15 | Disposition: A | Discharge status: Other Acute Inpatient Hospital | Payer: MEDICAID | Attending: Psychiatry | Admitting: Psychiatry

## 2023-03-14 DIAGNOSIS — F22 Delusional disorders: Secondary | ICD-10-CM | POA: Diagnosis not present

## 2023-03-14 DIAGNOSIS — F4310 Post-traumatic stress disorder, unspecified: Secondary | ICD-10-CM | POA: Insufficient documentation

## 2023-03-14 DIAGNOSIS — T43506A Underdosing of unspecified antipsychotics and neuroleptics, initial encounter: Secondary | ICD-10-CM | POA: Insufficient documentation

## 2023-03-14 DIAGNOSIS — Z91A28 Caregiver's intentional underdosing of medication regimen for other reason: Secondary | ICD-10-CM | POA: Insufficient documentation

## 2023-03-14 DIAGNOSIS — W3400XS Accidental discharge from unspecified firearms or gun, sequela: Secondary | ICD-10-CM | POA: Insufficient documentation

## 2023-03-14 DIAGNOSIS — F1210 Cannabis abuse, uncomplicated: Secondary | ICD-10-CM | POA: Diagnosis not present

## 2023-03-14 DIAGNOSIS — Y92149 Unspecified place in prison as the place of occurrence of the external cause: Secondary | ICD-10-CM | POA: Insufficient documentation

## 2023-03-14 LAB — LIPID PANEL
Cholesterol: 131 mg/dL (ref 0–200)
HDL: 54 mg/dL (ref >40)
LDL Cholesterol: 68 mg/dL (ref 0–99)
Total CHOL/HDL Ratio: 2.4 {ratio}
Triglycerides: 47 mg/dL (ref <150)
VLDL: 9 mg/dL (ref 0–40)

## 2023-03-14 LAB — CBC WITH DIFFERENTIAL/PLATELET
Abs Immature Granulocytes: 0.03 10*3/uL (ref 0.00–0.07)
Basophils Absolute: 0 10*3/uL (ref 0.0–0.1)
Basophils Relative: 0 %
Eosinophils Absolute: 0 10*3/uL (ref 0.0–0.5)
Eosinophils Relative: 0 %
HCT: 45.4 % (ref 39.0–52.0)
Hemoglobin: 15.5 g/dL (ref 13.0–17.0)
Immature Granulocytes: 0 %
Lymphocytes Relative: 17 %
Lymphs Abs: 1.4 10*3/uL (ref 0.7–4.0)
MCH: 27.3 pg (ref 26.0–34.0)
MCHC: 34.1 g/dL (ref 30.0–36.0)
MCV: 79.9 fL — ABNORMAL LOW (ref 80.0–100.0)
Monocytes Absolute: 0.4 10*3/uL (ref 0.1–1.0)
Monocytes Relative: 5 %
Neutro Abs: 6.7 10*3/uL (ref 1.7–7.7)
Neutrophils Relative %: 78 %
Platelets: 275 10*3/uL (ref 150–400)
RBC: 5.68 MIL/uL (ref 4.22–5.81)
RDW: 13.6 % (ref 11.5–15.5)
WBC: 8.6 10*3/uL (ref 4.0–10.5)
nRBC: 0 % (ref 0.0–0.2)

## 2023-03-14 LAB — COMPREHENSIVE METABOLIC PANEL
ALT: 20 U/L (ref 0–44)
AST: 21 U/L (ref 15–41)
Albumin: 4.4 g/dL (ref 3.5–5.0)
Alkaline Phosphatase: 68 U/L (ref 38–126)
Anion gap: 11 (ref 5–15)
BUN: 8 mg/dL (ref 6–20)
CO2: 26 mmol/L (ref 22–32)
Calcium: 9.8 mg/dL (ref 8.9–10.3)
Chloride: 103 mmol/L (ref 98–111)
Creatinine, Ser: 1.04 mg/dL (ref 0.61–1.24)
GFR, Estimated: >60 mL/min (ref >60)
Glucose, Bld: 99 mg/dL (ref 70–99)
Potassium: 3.8 mmol/L (ref 3.5–5.1)
Sodium: 140 mmol/L (ref 135–145)
Total Bilirubin: 0.6 mg/dL (ref 0.3–1.2)
Total Protein: 6.6 g/dL (ref 6.5–8.1)

## 2023-03-14 LAB — HEMOGLOBIN A1C
Hgb A1c MFr Bld: 5.8 % — ABNORMAL HIGH (ref 4.8–5.6)
Mean Plasma Glucose: 119.76 mg/dL

## 2023-03-14 LAB — ETHANOL: Alcohol, Ethyl (B): <10 mg/dL (ref <10)

## 2023-03-14 LAB — TSH: TSH: 0.945 u[IU]/mL (ref 0.350–4.500)

## 2023-03-14 LAB — HIV ANTIBODY (ROUTINE TESTING W REFLEX): HIV Screen 4th Generation wRfx: NONREACTIVE

## 2023-03-14 MED ORDER — TRAZODONE HCL 50 MG PO TABS: 50.0000 mg | ORAL_TABLET | Freq: Every evening | ORAL | Status: DC | PRN

## 2023-03-14 MED ORDER — MAGNESIUM HYDROXIDE 400 MG/5ML PO SUSP: 30.0000 mL | Freq: Every day | ORAL | Status: DC | PRN

## 2023-03-14 MED ORDER — PRAZOSIN HCL 1 MG PO CAPS
1.0000 mg | ORAL_CAPSULE | Freq: Every day | ORAL | Status: DC
  Administered 2023-03-14: 1 mg via ORAL
  Filled 2023-03-14: qty 1

## 2023-03-14 MED ORDER — ALUM & MAG HYDROXIDE-SIMETH 200-200-20 MG/5ML PO SUSP: 30.0000 mL | ORAL | Status: DC | PRN

## 2023-03-14 MED ORDER — ESCITALOPRAM OXALATE 10 MG PO TABS
10.0000 mg | ORAL_TABLET | Freq: Every day | ORAL | Status: DC
  Administered 2023-03-14: 10 mg via ORAL
  Filled 2023-03-14: qty 1

## 2023-03-14 MED ORDER — ACETAMINOPHEN 325 MG PO TABS: 650.0000 mg | ORAL_TABLET | Freq: Four times a day (QID) | ORAL | Status: DC | PRN

## 2023-03-14 MED ORDER — OLANZAPINE 5 MG PO TABS
5.0000 mg | ORAL_TABLET | Freq: Two times a day (BID) | ORAL | Status: DC
  Administered 2023-03-14: 5 mg via ORAL
  Filled 2023-03-14: qty 1

## 2023-03-14 MED ORDER — HYDROXYZINE HCL 25 MG PO TABS: 25.0000 mg | ORAL_TABLET | Freq: Three times a day (TID) | ORAL | Status: DC | PRN

## 2023-03-14 NOTE — ED Notes (Signed)
Patient was given a sandwich and a hot meal.

## 2023-03-14 NOTE — ED Notes (Signed)
Patient observed/assessed in bed/chair resting quietly appearing in no distress and verbalizing no complaints at this time. Will continue to monitor.  
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
## 2023-03-14 NOTE — ED Notes (Signed)
Patient A&Ox4. He is calm and cooperative. Patient was oriented to the unit. He denies intent to harm self/others when asked. Denies SI or A/VH. Patient denies any physical complaints. No acute distress observed. Skin check conducted by this RN and Marchelle Folks, MHT. Patient agreed to notify staff if thoughts of harm toward self or others arise. Will continue to monitor for safety.

## 2023-03-14 NOTE — Progress Notes (Addendum)
Pt was accepted to [facility] TODAY10/17/2024; Bed Assignment 407-1 PENDING Labs, UDS, EKG, and Signed Vol consent uploaded to chart or faxed to [facility] [phone]  [id number]  Pt meets inpatient criteria per Loreen Freud  Attending Physician will be Dr. Phineas Inches, MD   Report can be called to: Adult unit: [id number]  Pt can arrive after: [facility] will coordinate with care team upon completion of PENDING items.  Care Team notified:Day [facility], Night [facility] Herold Harms, Patrice White,NP, [place], Johnny Portland, [place], Seria Amado Coe [address] [place], LCSWA 03/14/2023 @ 7:41 PM

## 2023-03-14 NOTE — Discharge Instructions (Addendum)
Transfer to [facility] 

## 2023-03-14 NOTE — ED Notes (Signed)
Patient denies pain and is resting comfortably.  

## 2023-03-15 ENCOUNTER — Encounter (HOSPITAL_COMMUNITY): Payer: Self-pay

## 2023-03-15 ENCOUNTER — Inpatient Hospital Stay (HOSPITAL_COMMUNITY)
Admission: AD | Admit: 2023-03-15 | Discharge: 2023-03-20 | DRG: 885 | Disposition: A | Discharge status: Home / Self Care | Payer: MEDICAID | Source: Intra-hospital | Attending: Psychiatry | Admitting: Psychiatry

## 2023-03-15 ENCOUNTER — Encounter (HOSPITAL_COMMUNITY): Payer: Self-pay | Admitting: Behavioral Health

## 2023-03-15 ENCOUNTER — Other Ambulatory Visit: Payer: Self-pay

## 2023-03-15 DIAGNOSIS — F913 Oppositional defiant disorder: Secondary | ICD-10-CM | POA: Diagnosis present

## 2023-03-15 DIAGNOSIS — Z91199 Patient's noncompliance with other medical treatment and regimen due to unspecified reason: Secondary | ICD-10-CM

## 2023-03-15 DIAGNOSIS — Z803 Family history of malignant neoplasm of breast: Secondary | ICD-10-CM

## 2023-03-15 DIAGNOSIS — F22 Delusional disorders: Secondary | ICD-10-CM | POA: Diagnosis present

## 2023-03-15 DIAGNOSIS — G629 Polyneuropathy, unspecified: Secondary | ICD-10-CM | POA: Diagnosis present

## 2023-03-15 DIAGNOSIS — F23 Brief psychotic disorder: Principal | ICD-10-CM

## 2023-03-15 DIAGNOSIS — Z832 Family history of diseases of the blood and blood-forming organs and certain disorders involving the immune mechanism: Secondary | ICD-10-CM | POA: Diagnosis not present

## 2023-03-15 DIAGNOSIS — Q9388 Other microdeletions: Secondary | ICD-10-CM

## 2023-03-15 DIAGNOSIS — F17200 Nicotine dependence, unspecified, uncomplicated: Secondary | ICD-10-CM | POA: Diagnosis present

## 2023-03-15 DIAGNOSIS — F819 Developmental disorder of scholastic skills, unspecified: Secondary | ICD-10-CM | POA: Diagnosis present

## 2023-03-15 DIAGNOSIS — Z833 Family history of diabetes mellitus: Secondary | ICD-10-CM | POA: Diagnosis not present

## 2023-03-15 DIAGNOSIS — Z79899 Other long term (current) drug therapy: Secondary | ICD-10-CM | POA: Diagnosis not present

## 2023-03-15 DIAGNOSIS — F1290 Cannabis use, unspecified, uncomplicated: Secondary | ICD-10-CM | POA: Diagnosis present

## 2023-03-15 DIAGNOSIS — W3400XS Accidental discharge from unspecified firearms or gun, sequela: Secondary | ICD-10-CM | POA: Diagnosis not present

## 2023-03-15 DIAGNOSIS — F1210 Cannabis abuse, uncomplicated: Secondary | ICD-10-CM | POA: Diagnosis present

## 2023-03-15 DIAGNOSIS — F4310 Post-traumatic stress disorder, unspecified: Secondary | ICD-10-CM | POA: Diagnosis present

## 2023-03-15 DIAGNOSIS — F411 Generalized anxiety disorder: Secondary | ICD-10-CM | POA: Diagnosis present

## 2023-03-15 DIAGNOSIS — F909 Attention-deficit hyperactivity disorder, unspecified type: Secondary | ICD-10-CM | POA: Diagnosis present

## 2023-03-15 DIAGNOSIS — J45909 Unspecified asthma, uncomplicated: Secondary | ICD-10-CM | POA: Diagnosis present

## 2023-03-15 DIAGNOSIS — F250 Schizoaffective disorder, bipolar type: Principal | ICD-10-CM | POA: Diagnosis present

## 2023-03-15 DIAGNOSIS — C50929 Malignant neoplasm of unspecified site of unspecified male breast: Secondary | ICD-10-CM | POA: Diagnosis present

## 2023-03-15 LAB — RPR: RPR Ser Ql: NONREACTIVE

## 2023-03-15 MED ORDER — HALOPERIDOL 5 MG PO TABS: 5.0000 mg | ORAL_TABLET | Freq: Three times a day (TID) | ORAL | Status: DC | PRN

## 2023-03-15 MED ORDER — RISPERIDONE 1 MG PO TBDP
1.0000 mg | ORAL_TABLET | Freq: Two times a day (BID) | ORAL | Status: DC
  Administered 2023-03-15 – 2023-03-17 (×5): 1 mg via ORAL
  Filled 2023-03-15 (×12): qty 1

## 2023-03-15 MED ORDER — OLANZAPINE 5 MG PO TABS
5.0000 mg | ORAL_TABLET | Freq: Two times a day (BID) | ORAL | Status: DC
  Administered 2023-03-15: 5 mg via ORAL
  Filled 2023-03-15 (×5): qty 1

## 2023-03-15 MED ORDER — TRAZODONE HCL 50 MG PO TABS
50.0000 mg | ORAL_TABLET | Freq: Every evening | ORAL | Status: DC | PRN
  Administered 2023-03-15 – 2023-03-19 (×5): 50 mg via ORAL
  Filled 2023-03-15 (×5): qty 1

## 2023-03-15 MED ORDER — LORAZEPAM 2 MG/ML IJ SOLN: 2.0000 mg | Freq: Three times a day (TID) | INTRAMUSCULAR | Status: DC | PRN

## 2023-03-15 MED ORDER — GABAPENTIN 100 MG PO CAPS
200.0000 mg | ORAL_CAPSULE | Freq: Three times a day (TID) | ORAL | Status: DC
  Administered 2023-03-15 – 2023-03-20 (×15): 200 mg via ORAL
  Filled 2023-03-15 (×23): qty 2

## 2023-03-15 MED ORDER — PRAZOSIN HCL 1 MG PO CAPS
1.0000 mg | ORAL_CAPSULE | Freq: Every day | ORAL | Status: DC
  Administered 2023-03-15 – 2023-03-19 (×5): 1 mg via ORAL
  Filled 2023-03-15 (×7): qty 1

## 2023-03-15 MED ORDER — HYDROXYZINE HCL 25 MG PO TABS
25.0000 mg | ORAL_TABLET | Freq: Three times a day (TID) | ORAL | Status: DC | PRN
  Administered 2023-03-17 – 2023-03-19 (×4): 25 mg via ORAL
  Filled 2023-03-15 (×5): qty 1

## 2023-03-15 MED ORDER — ACETAMINOPHEN 325 MG PO TABS
650.0000 mg | ORAL_TABLET | Freq: Four times a day (QID) | ORAL | Status: DC | PRN
  Administered 2023-03-17 – 2023-03-18 (×2): 650 mg via ORAL
  Filled 2023-03-15 (×2): qty 2

## 2023-03-15 MED ORDER — LORAZEPAM 1 MG PO TABS: 2.0000 mg | ORAL_TABLET | Freq: Three times a day (TID) | ORAL | Status: DC | PRN

## 2023-03-15 MED ORDER — ALUM & MAG HYDROXIDE-SIMETH 200-200-20 MG/5ML PO SUSP: 30.0000 mL | ORAL | Status: DC | PRN

## 2023-03-15 MED ORDER — ESCITALOPRAM OXALATE 10 MG PO TABS
10.0000 mg | ORAL_TABLET | Freq: Every day | ORAL | Status: DC
  Administered 2023-03-15: 10 mg via ORAL
  Filled 2023-03-15 (×3): qty 1

## 2023-03-15 MED ORDER — ENSURE ENLIVE PO LIQD
237.0000 mL | Freq: Three times a day (TID) | ORAL | Status: DC
  Administered 2023-03-19: 237 mL via ORAL
  Filled 2023-03-15 (×22): qty 237

## 2023-03-15 MED ORDER — MAGNESIUM HYDROXIDE 400 MG/5ML PO SUSP: 30.0000 mL | Freq: Every day | ORAL | Status: DC | PRN

## 2023-03-15 MED ORDER — HALOPERIDOL LACTATE 5 MG/ML IJ SOLN: 5.0000 mg | Freq: Three times a day (TID) | INTRAMUSCULAR | Status: DC | PRN

## 2023-03-15 MED ORDER — DIPHENHYDRAMINE HCL 25 MG PO CAPS: 50.0000 mg | ORAL_CAPSULE | Freq: Three times a day (TID) | ORAL | Status: DC | PRN

## 2023-03-15 MED ORDER — NICOTINE POLACRILEX 2 MG MT GUM
2.0000 mg | CHEWING_GUM | OROMUCOSAL | Status: DC | PRN
  Administered 2023-03-16 – 2023-03-19 (×10): 2 mg via ORAL
  Filled 2023-03-15 (×10): qty 1

## 2023-03-15 MED ORDER — DIPHENHYDRAMINE HCL 50 MG/ML IJ SOLN: 50.0000 mg | Freq: Three times a day (TID) | INTRAMUSCULAR | Status: DC | PRN

## 2023-03-15 NOTE — Progress Notes (Signed)
Patient signed 72 hour request for discharge 03/15/23 at 1300.

## 2023-03-15 NOTE — Tx Team (Signed)
Initial Treatment Plan 03/15/2023 12:49 AM Roy Dudley KGM:[id number]    PATIENT STRESSORS: Legal issue   Marital or family conflict   Medication change or noncompliance     PATIENT STRENGTHS: General fund of knowledge  Motivation for treatment/growth  Supportive family/friends    PATIENT IDENTIFIED PROBLEMS: PTSD  Paranoia  "Self control, anger"                 DISCHARGE CRITERIA:  Improved stabilization in mood, thinking, and/or behavior Verbal commitment to aftercare and medication compliance  PRELIMINARY DISCHARGE PLAN: Attend aftercare/continuing care group Attend PHP/IOP Outpatient therapy  PATIENT/FAMILY INVOLVEMENT: This treatment plan has been presented to and reviewed with the patient, Roy Dudley.  The patient and family have been given the opportunity to ask questions and make suggestions.  Delos Haring, RN 03/15/2023, 12:49 AM

## 2023-03-15 NOTE — Plan of Care (Signed)
  Problem: Activity: Goal: Sleeping patterns will improve Outcome: Progressing   Problem: Coping: Goal: Ability to demonstrate self-control will improve Outcome: Progressing   Problem: Safety: Goal: Periods of time without injury will increase Outcome: Progressing   

## 2023-03-15 NOTE — [facility] Group Notes (Signed)
Pt did not attend AA group  
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
## 2023-03-15 NOTE — BH Assessment (Addendum)
CSW attempted to complete PSA with patient this morning at 9:30AM. Pt refused and stated "you are going to have to try again later I am not interested right now." Pt was still in bed at this point. CSW will attempt again later this afternoon.   CSW attempted again at 1:50pm, patient in bed and refused assessment a second time.

## 2023-03-15 NOTE — BHH Suicide Risk Assessment (Signed)
Suicide Risk Assessment  Admission Assessment    [facility] Admission Suicide Risk Assessment   Nursing information obtained from:  Patient Demographic factors:  Unemployed, Low socioeconomic status Current Mental Status:  NA Loss Factors:  Legal issues Historical Factors:  NA Risk Reduction Factors:  Living with another person, especially a relative  Total Time spent with patient: 1.5 hours Principal Problem: Schizoaffective disorder, bipolar type (HCC) Diagnosis:  Principal Problem:   Schizoaffective disorder, bipolar type (HCC) Active Problems:   Learning disability   PTSD (post-traumatic stress disorder)   Generalized anxiety disorder   Cannabis use disorder   Paranoia (psychosis) (HCC)  Subjective Data: psychosis and depressive symptoms  Continued Clinical Symptoms:  Alcohol Use Disorder Identification Test Final Score (AUDIT): 0 The "Alcohol Use Disorders Identification Test", Guidelines for Use in Primary Care, Second Edition.  World Science writer [facility]). Score between 0-7:  no or low risk or alcohol related problems. Score between 8-15:  moderate risk of alcohol related problems. Score between 16-19:  high risk of alcohol related problems. Score 20 or above:  warrants further diagnostic evaluation for alcohol dependence and treatment.  CLINICAL FACTORS:   Depression:   Anhedonia Delusional Insomnia Severe More than one psychiatric diagnosis Currently Psychotic   Musculoskeletal: Strength & Muscle Tone: within normal limits Gait & Station: normal Patient leans: N/A  Psychiatric Specialty Exam:  Presentation  General Appearance:  Disheveled  Eye Contact: Fair  Speech: Pressured  Speech Volume: Normal  Handedness: Right   Mood and Affect  Mood: Irritable; Anxious  Affect: Congruent   Thought Process  Thought Processes: Disorganized  Descriptions of Associations:Circumstantial  Orientation:Partial  Thought Content:Illogical;  Paranoid Ideation; Perseveration; Scattered; Rumination  History of Schizophrenia/Schizoaffective disorder:No  Duration of Psychotic Symptoms:Greater than six months  Hallucinations:Hallucinations: Auditory; Visual  Ideas of Reference:Percusatory; Paranoia; Delusions  Suicidal Thoughts:Suicidal Thoughts: No  Homicidal Thoughts:Homicidal Thoughts: No   Sensorium  Memory: Immediate Poor  Judgment: Poor  Insight: Poor   Executive Functions  Concentration: Fair  Attention Span: Fair  Recall: Poor  Fund of Knowledge: Poor  Language: Fair   Psychomotor Activity  Psychomotor Activity: Psychomotor Activity: Normal   Assets  Assets: Resilience   Sleep  Sleep: Sleep: Poor    Physical Exam: Physical Exam Review of Systems  Psychiatric/Behavioral:  Positive for depression, hallucinations and substance abuse. Negative for memory loss and suicidal ideas. The patient is nervous/anxious and has insomnia.    Blood pressure (!) 141/81, pulse 97, temperature 98.6 F (37 C), temperature source Oral, resp. rate 18, height 6' (1.829 m), weight 64.9 kg, SpO2 100%. Body mass index is 19.39 kg/m.   COGNITIVE FEATURES THAT CONTRIBUTE TO RISK:  None    SUICIDE RISK:   Severe:  Denies SI, but is currently psychotic; has paranoia which impairs his judgement. Pt also has a history of extreme aggression. Both factors above render him at a high risk of danger to self & others.  PLAN OF CARE:  See H & P  I certify that inpatient services furnished can reasonably be expected to improve the patient's condition.   Starleen Blue, NP 03/15/2023, 11:51 PM
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
## 2023-03-15 NOTE — Progress Notes (Signed)
   03/15/23 2045  Psych Admission Type (Psych Patients Only)  Admission Status Voluntary/72 hour document signed  Date 72 hour document signed  03/15/23  Time 72 hour document signed  1300  Psychosocial Assessment  Patient Complaints None  Eye Contact Fair  Facial Expression Sad  Affect Appropriate to circumstance  Speech Soft  Interaction Cautious;Assertive  Motor Activity Slow  Appearance/Hygiene Unremarkable  Behavior Characteristics Anxious  Mood Depressed;Anxious  Aggressive Behavior  Effect No apparent injury  Thought Process  Coherency WDL  Content WDL  Delusions WDL  Perception WDL  Hallucination None reported or observed  Judgment WDL  Confusion None  Danger to Self  Current suicidal ideation? Denies  Danger to Others  Danger to Others None reported or observed

## 2023-03-15 NOTE — Group Note (Signed)
Recreation Therapy Group Note   Group Topic:Problem Solving  Group Date: 03/15/2023 Start Time: 0935 End Time: 1007 Facilitators: Ilias Stcharles-McCall, LRT,CTRS Location: [address] Dayroom   Goal Area(s) Addresses:  Patient will effectively work in a team with other group members. Patient will verbalize importance of using appropriate problem solving techniques.  Patient will identify positive change associated with effective problem solving skills.   Group Description: Brain Teasers. Patients were given two sheets of brain teasers. Patients worked together to Radiation protection practitioner.     Education Outcome: Acknowledges understanding/In group clarification offered/Needs additional education.    Affect/Mood: N/A   Participation Level: Did not attend    Clinical Observations/Individualized Feedback:     Plan: Continue to engage patient in RT group sessions 2-3x/week.   Khalis Hittle-McCall, LRT,CTRS 03/15/2023 12:20 PM

## 2023-03-15 NOTE — BH IP Treatment Plan (Signed)
Interdisciplinary Treatment and Diagnostic Plan Update  03/15/2023 Time of Session: 10:45 AM - PT DID NOT ATTEND - REFUSED Roy Dudley MRN: [id number]  Principal Diagnosis: Paranoia (psychosis) (HCC)  Secondary Diagnoses: Principal Problem:   Paranoia (psychosis) (HCC)   Current Medications:  Current Facility-Administered Medications  Medication Dose Route Frequency Provider Last Rate Last Admin   acetaminophen (TYLENOL) tablet 650 mg  650 mg Oral Q6H PRN White, Patrice L, NP       alum & mag hydroxide-simeth (MAALOX/MYLANTA) 200-200-20 MG/5ML suspension 30 mL  30 mL Oral Q4H PRN White, Patrice L, NP       diphenhydrAMINE (BENADRYL) capsule 50 mg  50 mg Oral TID PRN White, Patrice L, NP       Or   diphenhydrAMINE (BENADRYL) injection 50 mg  50 mg Intramuscular TID PRN White, Patrice L, NP       escitalopram (LEXAPRO) tablet 10 mg  10 mg Oral Daily White, Patrice L, NP   10 mg at 03/15/23 1134   feeding supplement (ENSURE ENLIVE / ENSURE PLUS) liquid 237 mL  237 mL Oral TID Sindy Guadeloupe, NP       haloperidol (HALDOL) tablet 5 mg  5 mg Oral TID PRN White, Patrice L, NP       Or   haloperidol lactate (HALDOL) injection 5 mg  5 mg Intramuscular TID PRN White, Patrice L, NP       hydrOXYzine (ATARAX) tablet 25 mg  25 mg Oral TID PRN White, Patrice L, NP       LORazepam (ATIVAN) tablet 2 mg  2 mg Oral TID PRN White, Patrice L, NP       Or   LORazepam (ATIVAN) injection 2 mg  2 mg Intramuscular TID PRN White, Patrice L, NP       magnesium hydroxide (MILK OF MAGNESIA) suspension 30 mL  30 mL Oral Daily PRN White, Patrice L, NP       nicotine polacrilex (NICORETTE) gum 2 mg  2 mg Oral PRN Sindy Guadeloupe, NP       OLANZapine (ZYPREXA) tablet 5 mg  5 mg Oral BID White, Patrice L, NP   5 mg at 03/15/23 1134   prazosin (MINIPRESS) capsule 1 mg  1 mg Oral QHS White, Patrice L, NP       traZODone (DESYREL) tablet 50 mg  50 mg Oral QHS PRN White, Patrice L, NP       PTA Medications: No  medications prior to admission.    Patient Stressors: Legal issue   Marital or family conflict   Medication change or noncompliance    Patient Strengths: General fund of knowledge  Motivation for treatment/growth  Supportive family/friends   Treatment Modalities: Medication Management, Group therapy, Case management,  1 to 1 session with clinician, Psychoeducation, Recreational therapy.   Physician Treatment Plan for Primary Diagnosis: Paranoia (psychosis) (HCC) Long Term Goal(s):     Short Term Goals:    Medication Management: Evaluate patient's response, side effects, and tolerance of medication regimen.  Therapeutic Interventions: 1 to 1 sessions, Unit Group sessions and Medication administration.  Evaluation of Outcomes: Not Progressing  Physician Treatment Plan for Secondary Diagnosis: Principal Problem:   Paranoia (psychosis) (HCC)  Long Term Goal(s):     Short Term Goals:       Medication Management: Evaluate patient's response, side effects, and tolerance of medication regimen.  Therapeutic Interventions: 1 to 1 sessions, Unit Group sessions and Medication administration.  Evaluation of Outcomes: Not Progressing  RN Treatment Plan for Primary Diagnosis: Paranoia (psychosis) (HCC) Long Term Goal(s): Knowledge of disease and therapeutic regimen to maintain health will improve  Short Term Goals: Ability to remain free from injury will improve, Ability to verbalize frustration and anger appropriately will improve, Ability to demonstrate self-control, Ability to participate in decision making will improve, Ability to verbalize feelings will improve, Ability to disclose and discuss suicidal ideas, Ability to identify and develop effective coping behaviors will improve, and Compliance with prescribed medications will improve  Medication Management: RN will administer medications as ordered by provider, will assess and evaluate patient's response and provide education to  patient for prescribed medication. RN will report any adverse and/or side effects to prescribing provider.  Therapeutic Interventions: 1 on 1 counseling sessions, Psychoeducation, Medication administration, Evaluate responses to treatment, Monitor vital signs and CBGs as ordered, Perform/monitor CIWA, COWS, AIMS and Fall Risk screenings as ordered, Perform wound care treatments as ordered.  Evaluation of Outcomes: Not Progressing   LCSW Treatment Plan for Primary Diagnosis: Paranoia (psychosis) (HCC) Long Term Goal(s): Safe transition to appropriate next level of care at discharge, Engage patient in therapeutic group addressing interpersonal concerns.  Short Term Goals: Engage patient in aftercare planning with referrals and resources, Increase social support, Increase ability to appropriately verbalize feelings, Increase emotional regulation, Facilitate acceptance of mental health diagnosis and concerns, Facilitate patient progression through stages of change regarding substance use diagnoses and concerns, Identify triggers associated with mental health/substance abuse issues, and Increase skills for wellness and recovery  Therapeutic Interventions: Assess for all discharge needs, 1 to 1 time with Social worker, Explore available resources and support systems, Assess for adequacy in community support network, Educate family and significant other(s) on suicide prevention, Complete Psychosocial Assessment, Interpersonal group therapy.  Evaluation of Outcomes: Not Progressing   Progress in Treatment: Attending groups: No. Participating in groups: No. Taking medication as prescribed: Yes. Toleration medication: Yes. Family/Significant other contact made: No, will contact:  Unable to complete PSA and get contact info for someone. Pt refusing assessment. CSW will attempt again and get contact information Patient understands diagnosis: Yes. Discussing patient identified problems/goals with staff:  No. Medical problems stabilized or resolved: Yes. Denies suicidal/homicidal ideation: Yes. Issues/concerns per patient self-inventory: No.  New problem(s) identified: No, Describe:  none reported  New Short Term/Long Term Goal(s): medication stabilization, elimination of SI thoughts, development of comprehensive mental wellness plan.    Patient Goals:  PT DID NOT ATTEND TX TEAM - REFUSED  Discharge Plan or Barriers: Patient recently admitted. CSW will continue to follow and assess for appropriate referrals and possible discharge planning.    Reason for Continuation of Hospitalization: Anxiety Depression Medication stabilization Suicidal ideation  Estimated Length of Stay: 5-7 days  Last 3 [place] Suicide Severity Risk Score: Flowsheet Row [facility] from 03/14/2023 in [facility] (Discharged) [facility]  C-SSRS RISK CATEGORY No Risk No Risk No Risk       Last PHQ 2/9 Scores:    01/04/2021   11:06 AM  Depression screen PHQ 2/9  Decreased Interest 1  Down, Depressed, Hopeless 2  PHQ - 2 Score 3  Altered sleeping 0  Tired, decreased energy 0  Feeling bad or failure about yourself  2  Trouble concentrating 0  Moving slowly or fidgety/restless 0  Suicidal thoughts 1  PHQ-9 Score 6  Difficult doing work/chores Somewhat difficult  Scribe for Treatment Team: Kathi Der, Theresia Majors 03/15/2023 11:34 AM

## 2023-03-15 NOTE — Progress Notes (Signed)
   03/15/23 1200  Psych Admission Type (Psych Patients Only)  Admission Status Voluntary  Psychosocial Assessment  Patient Complaints None  Eye Contact Fair  Facial Expression Anxious  Affect Appropriate to circumstance  Speech Logical/coherent  Interaction Cautious  Motor Activity Other (Comment) (WNL)  Appearance/Hygiene Unremarkable  Behavior Characteristics Cooperative;Anxious  Mood Depressed;Anxious  Thought Process  Coherency WDL  Content WDL  Delusions None reported or observed  Perception WDL  Hallucination None reported or observed  Judgment Impaired  Confusion None  Danger to Self  Current suicidal ideation? Denies  Agreement Not to Harm Self Yes  Description of Agreement verbal  Danger to Others  Danger to Others None reported or observed

## 2023-03-16 DIAGNOSIS — F23 Brief psychotic disorder: Principal | ICD-10-CM

## 2023-03-16 DIAGNOSIS — F250 Schizoaffective disorder, bipolar type: Secondary | ICD-10-CM | POA: Diagnosis not present

## 2023-03-16 NOTE — Plan of Care (Signed)
  Problem: Education: Goal: Emotional status will improve Outcome: Progressing   Problem: Activity: Goal: Interest or engagement in activities will improve Outcome: Progressing   Problem: Physical Regulation: Goal: Ability to maintain clinical measurements within normal limits will improve Outcome: Progressing
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
## 2023-03-16 NOTE — Progress Notes (Signed)
   03/16/23 2100  Psych Admission Type (Psych Patients Only)  Admission Status Voluntary/72 hour document signed  Date 72 hour document signed  03/15/23  Psychosocial Assessment  Patient Complaints None  Eye Contact Fair  Facial Expression Flat  Affect Appropriate to circumstance  Speech Soft  Interaction Isolative  Motor Activity Slow  Appearance/Hygiene Disheveled  Behavior Characteristics Appropriate to situation;Calm  Mood Other (Comment) (calm, pt has been asleep)  Thought Process  Coherency Unable to assess  Content UTA  Delusions UTA  Perception UTA  Hallucination UTA  Judgment UTA  Confusion UTA  Danger to Self  Current suicidal ideation? Denies

## 2023-03-16 NOTE — Progress Notes (Signed)
03/16/2023         Time: 1500    Group Topic/Focus: Rec Therapy  Participation Level: Active  Participation Quality: Appropriate  Affect: Appropriate  Cognitive: Appropriate   Additional Comments: Patient attended   Octavio Manns 03/16/2023 3:12 PM
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
## 2023-03-16 NOTE — Group Note (Signed)
Date:  03/16/2023 Time:  9:41 PM  Group Topic/Focus:  Wrap-Up Group:   The focus of this group is to help patients review their daily goal of treatment and discuss progress on daily workbooks.    Participation Level:  Did Not Attend  Participation Quality:   Did Not Attend  Affect:   Did Not Attend  Cognitive:   Did Not Attend  Insight: None  Engagement in Group:   Did Not Attend  Modes of Intervention:   Did Not Attend  Additional Comments:  Pt was encouraged to attend wrap up group but did not attend.  Felipa Furnace 03/16/2023, 9:41 PM

## 2023-03-16 NOTE — Progress Notes (Signed)
   03/16/23 1300  Psych Admission Type (Psych Patients Only)  Admission Status Voluntary/72 hour document signed  Psychosocial Assessment  Patient Complaints None  Eye Contact Fair  Facial Expression Anxious  Affect Appropriate to circumstance  Speech Soft  Interaction Cautious  Motor Activity Slow  Appearance/Hygiene Unremarkable  Behavior Characteristics Cooperative;Appropriate to situation  Mood Anxious  Aggressive Behavior  Effect No apparent injury  Thought Process  Coherency WDL  Content WDL  Delusions WDL  Perception WDL  Hallucination None reported or observed  Judgment WDL  Confusion None  Danger to Self  Current suicidal ideation? Denies  Agreement Not to Harm Self Yes  Description of Agreement agreed to contact staff before acting on harmful thoughts  Danger to Others  Danger to Others None reported or observed
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
## 2023-03-17 DIAGNOSIS — F250 Schizoaffective disorder, bipolar type: Secondary | ICD-10-CM | POA: Diagnosis not present

## 2023-03-17 MED ORDER — RISPERIDONE 2 MG PO TABS
2.0000 mg | ORAL_TABLET | Freq: Every day | ORAL | Status: DC
Start: 2023-03-17 — End: 2023-03-17

## 2023-03-17 MED ORDER — RISPERIDONE 2 MG PO TBDP
2.0000 mg | ORAL_TABLET | Freq: Every day | ORAL | Status: DC
  Administered 2023-03-17 – 2023-03-19 (×3): 2 mg via ORAL
  Filled 2023-03-17 (×5): qty 1

## 2023-03-17 MED ORDER — RISPERIDONE 1 MG PO TBDP
1.0000 mg | ORAL_TABLET | Freq: Every day | ORAL | Status: DC
  Administered 2023-03-18: 1 mg via ORAL
  Filled 2023-03-17 (×3): qty 1

## 2023-03-17 NOTE — Plan of Care (Signed)
  Problem: Activity: Goal: Sleeping patterns will improve Outcome: Progressing   Problem: Safety: Goal: Periods of time without injury will increase Outcome: Progressing   
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
## 2023-03-17 NOTE — Progress Notes (Signed)
   03/17/23 2228  Psych Admission Type (Psych Patients Only)  Admission Status Voluntary/72 hour document signed  Psychosocial Assessment  Patient Complaints None  Eye Contact Fair  Facial Expression Flat  Affect Appropriate to circumstance  Speech Soft  Interaction Isolative  Motor Activity Slow  Appearance/Hygiene Disheveled  Behavior Characteristics Cooperative  Mood Pleasant  Thought Process  Coherency WDL  Content WDL  Delusions None reported or observed  Perception WDL  Hallucination None reported or observed  Judgment Poor  Confusion None  Danger to Self  Current suicidal ideation? Denies

## 2023-03-17 NOTE — Progress Notes (Signed)
Pt irritable this morning, affect is calm this evening. Pt medication compliant. Pt noted with some paranoia at times. Pt is eating and tolerating fluids. Pt denies SI/HI and AVH. Q 15 minute checks ongoing.

## 2023-03-17 NOTE — Group Note (Signed)
Date:  03/17/2023 Time:  8:30 PM  Group Topic/Focus:  Wrap-Up Group:   The focus of this group is to help patients review their daily goal of treatment and discuss progress on daily workbooks.    Participation Level:  Active  Participation Quality:  Appropriate  Affect:  Appropriate  Cognitive:  Appropriate  Insight: Appropriate  Engagement in Group:  Developing/Improving  Modes of Intervention:  Discussion  Additional Comments:  Pt stated his goal for today was to focus on his treatment plan and discuss his discharge plan with his treatment team. Pt stated he accomplished his goals today. Pt stated he talked with his doctor and social worker about his care today. Pt rated his overall day a 10. Pt stated the plan is for him to discharge on 03/18/2023. Pt stated he contacted his mother, brother and his sister today which help improved his overall day.  Pt stated he felt better about himself today. Pt stated he was able to attend all meals. Pt stated he took all medications provided today. Pt stated he attend all groups held today. Pt stated his appetite was pretty good today. Pt rated sleep last night was pretty good. Pt stated the goal tonight was to get some rest. Pt stated he had some physical pain tonight. Pt stated he had some severe pain in both his right and left hip. Pt rated the pain in both hips an 8 on the pain level scale. Pt nurse was updated on the situation.  Pt deny visual hallucinations and auditory issues tonight. Pt denies thoughts of harming himself or others. Pt stated he would alert staff if anything changed  Felipa Furnace 03/17/2023, 8:30 PM

## 2023-03-17 NOTE — Plan of Care (Signed)
  Problem: Education: Goal: Knowledge of [place] [facility] Education information/materials will improve Outcome: Progressing Goal: Emotional status will improve Outcome: Progressing Goal: Mental status will improve Outcome: Progressing Goal: Verbalization of understanding the information provided will improve Outcome: Progressing   Problem: Activity: Goal: Interest or engagement in activities will improve Outcome: Progressing Goal: Sleeping patterns will improve Outcome: Progressing   Problem: Coping: Goal: Ability to verbalize frustrations and anger appropriately will improve Outcome: Progressing Goal: Ability to demonstrate self-control will improve Outcome: Progressing   Problem: Health Behavior/Discharge Planning: Goal: Identification of resources available to assist in meeting health care needs will improve Outcome: Progressing Goal: Compliance with treatment plan for underlying cause of condition will improve Outcome: Progressing   Problem: Physical Regulation: Goal: Ability to maintain clinical measurements within normal limits will improve Outcome: Progressing   Problem: Safety: Goal: Periods of time without injury will increase Outcome: Progressing   Problem: Education: Goal: Knowledge of [place] [facility] Education information/materials will improve Outcome: Progressing Goal: Emotional status will improve Outcome: Progressing Goal: Mental status will improve Outcome: Progressing Goal: Verbalization of understanding the information provided will improve Outcome: Progressing   Problem: Activity: Goal: Interest or engagement in activities will improve Outcome: Progressing Goal: Sleeping patterns will improve Outcome: Progressing   Problem: Coping: Goal: Ability to verbalize frustrations and anger appropriately will improve Outcome: Progressing Goal: Ability to demonstrate self-control will improve Outcome: Progressing   Problem: Health  Behavior/Discharge Planning: Goal: Identification of resources available to assist in meeting health care needs will improve Outcome: Progressing Goal: Compliance with treatment plan for underlying cause of condition will improve Outcome: Progressing   Problem: Physical Regulation: Goal: Ability to maintain clinical measurements within normal limits will improve Outcome: Progressing   Problem: Safety: Goal: Periods of time without injury will increase Outcome: Progressing   Problem: Activity: Goal: Will identify at least one activity in which they can participate Outcome: Progressing   Problem: Coping: Goal: Ability to identify and develop effective coping behavior will improve Outcome: Progressing Goal: Ability to interact with others will improve Outcome: Progressing Goal: Demonstration of participation in decision-making regarding own care will improve Outcome: Progressing Goal: Ability to use eye contact when communicating with others will improve Outcome: Progressing   Problem: Health Behavior/Discharge Planning: Goal: Identification of resources available to assist in meeting health care needs will improve Outcome: Progressing   Problem: Self-Concept: Goal: Will verbalize positive feelings about self Outcome: Progressing   Problem: Education: Goal: Verbalization of understanding the information provided will improve Outcome: Progressing   Problem: Coping: Goal: Ability to demonstrate appropriate behavior when angry will improve Outcome: Progressing Goal: Ability to identify and develop effective coping behavior will improve Outcome: Progressing Goal: Ability to interact with others will improve Outcome: Progressing   Problem: Health Behavior/Discharge Planning: Goal: Identification of resources available to assist in meeting health care needs will improve Outcome: Progressing

## 2023-03-18 DIAGNOSIS — F250 Schizoaffective disorder, bipolar type: Secondary | ICD-10-CM | POA: Diagnosis not present

## 2023-03-18 LAB — GC/CHLAMYDIA PROBE AMP (~~LOC~~) NOT AT ARMC
Chlamydia: NEGATIVE
Comment: NEGATIVE
Comment: NORMAL
Neisseria Gonorrhea: NEGATIVE

## 2023-03-18 MED ORDER — RISPERIDONE 2 MG PO TBDP
2.0000 mg | ORAL_TABLET | Freq: Every day | ORAL | Status: DC
  Administered 2023-03-19 – 2023-03-20 (×2): 2 mg via ORAL
  Filled 2023-03-18 (×3): qty 1

## 2023-03-18 NOTE — Group Note (Signed)
Date:  03/18/2023 Time:  9:41 AM  Group Topic/Focus:  Goals Group:   The focus of this group is to help patients establish daily goals to achieve during treatment and discuss how the patient can incorporate goal setting into their daily lives to aide in recovery.    Participation Level:  Did Not Attend   Roy Dudley 03/18/2023, 9:41 AM

## 2023-03-18 NOTE — Progress Notes (Signed)
   03/18/23 2000  Psychosocial Assessment  Patient Complaints None  Eye Contact Fair  Facial Expression Flat  Affect Appropriate to circumstance  Speech Soft  Interaction Cautious  Motor Activity Slow  Appearance/Hygiene Unremarkable  Behavior Characteristics Cooperative  Mood Pleasant  Aggressive Behavior  Effect No apparent injury  Thought Process  Coherency Circumstantial  Content WDL  Delusions WDL  Perception WDL  Hallucination None reported or observed  Judgment Limited  Confusion None  Danger to Self  Current suicidal ideation? Denies

## 2023-03-18 NOTE — Group Note (Signed)
Recreation Therapy Group Note   Group Topic:Coping Skills  Group Date: 03/18/2023 Start Time: 1015 End Time: 1045 Facilitators: Sherlon Nied-McCall, LRT,CTRS Location: [address] Dayroom   Group Topic: Coping Skills   Goal Area(s) Addresses: Patient will define what a coping skill is. Patient will create a list of healthy coping skills beginning with each letter of the alphabet. Patient will successfully identify positive coping skills they can use post d/c.   Group Description: Coping A to Z. Patient asked to identify what a coping skill is and when they use them. Patients with Clinical research associate discussed healthy versus unhealthy coping skills. Next patients were given a blank worksheet titled "Coping Skills A-Z". Patients were instructed to come up with at least one positive coping skill per letter of the alphabet. Patients were given 15 minutes to brainstorm before ideas were presented to the large group. Patients and LRT debriefed on the importance of coping skill selection based on situation and back-up plans when a skill tried is not effective. At the end of group, patients were given an handout of alphabetized strategies to keep for future reference.   Education: Pharmacologist, Scientist, physiological, Discharge Planning.    Education Outcome: Acknowledges education/Verbalizes understanding/In group clarification offered/Additional education needed   Affect/Mood: N/A   Participation Level: Did not attend    Clinical Observations/Individualized Feedback:     Plan: Continue to engage patient in RT group sessions 2-3x/week.   Choua Ikner-McCall, LRT,CTRS  03/18/2023 11:43 AM

## 2023-03-18 NOTE — Progress Notes (Signed)
Recreation Therapy Notes  INPATIENT RECREATION THERAPY ASSESSMENT  Patient Details Name: Roy Dudley MRN: [id number] DOB: [birth year] Today's Date: 03/18/2023       Information Obtained From: Patient  Able to Participate in Assessment/Interview: Yes  Patient Presentation: Alert  Reason for Admission (Per Patient): Other (Comments) (per chart: PTSD, Paranoia)  Patient Stressors: Other (Comment) ("mom going through cancer treatment")  Coping Skills:   Sports, Music, Meditate, Deep Breathing, Talk  Leisure Interests (2+):  Music - Write music, Music - Play instrument  Frequency of Recreation/Participation: Other (Comment) (Daily)  Awareness of Community Resources:  Yes  Community Resources:  Library, [place], [place]  Current Use: Yes  If no, Barriers?:    Expressed Interest in [employer] Information: No  [employer] of Residence:  [place]  Patient Main Form of Transportation: Set designer  Patient Strengths:  Family; Working out; Loss adjuster, chartered  Patient Identified Areas of Improvement:  Patience  Patient Goal for Hospitalization:  "going home"  Current SI (including self-harm):  No  Current HI:  No  Current AVH: No  Staff Intervention Plan: Group Attendance, Collaborate with Interdisciplinary Treatment Team  Consent to Intern Participation: N/A   Terrian Ridlon-McCall, LRT,CTRS Tomeko Scoville A Anaisabel Pederson-McCall 03/18/2023, 1:46 PM

## 2023-03-18 NOTE — Progress Notes (Signed)
   03/18/23 0752  Psych Admission Type (Psych Patients Only)  Admission Status Voluntary/72 hour document signed  Psychosocial Assessment  Patient Complaints None  Eye Contact Fair  Facial Expression Flat  Affect Appropriate to circumstance  Speech Soft  Interaction Minimal  Motor Activity Slow  Appearance/Hygiene Unremarkable  Behavior Characteristics Cooperative  Mood Pleasant  Thought Process  Coherency WDL  Content WDL  Delusions None reported or observed  Perception WDL  Hallucination None reported or observed  Judgment Poor  Confusion None  Danger to Self  Current suicidal ideation? Denies  Agreement Not to Harm Self Yes  Description of Agreement Verbal  Danger to Others  Danger to Others None reported or observed

## 2023-03-18 NOTE — Group Note (Signed)
[facility] LCSW Group Therapy Note    Group Date: 03/18/2023 Start Time: 1300 End Time: 1400  Type of Therapy and Topic:  Group Therapy:  Overcoming Obstacles  Participation Level:  [facility] PARTICIPATION LEVEL: Active  Mood:  Appropriate  Description of Group:   In this group patients will be encouraged to explore what they see as obstacles to their own wellness and recovery. They will be guided to discuss their thoughts, feelings, and behaviors related to these obstacles. The group will process together ways to cope with barriers, with attention given to specific choices patients can make. Each patient will be challenged to identify changes they are motivated to make in order to overcome their obstacles. This group will be process-oriented, with patients participating in exploration of their own experiences as well as giving and receiving support and challenge from other group members.  Therapeutic Goals: 1. Patient will identify personal and current obstacles as they relate to admission. 2. Patient will identify barriers that currently interfere with their wellness or overcoming obstacles.  3. Patient will identify feelings, thought process and behaviors related to these barriers. 4. Patient will identify two changes they are willing to make to overcome these obstacles:    Summary of Patient Progress Pt engaged in discussion and provided helpful insight.   Therapeutic Modalities:   Cognitive Behavioral Therapy Solution Focused Therapy Motivational Interviewing Relapse Prevention Therapy   Destynie Toomey S Tremon Sainvil, LCSW

## 2023-03-18 NOTE — Plan of Care (Signed)
  Problem: Education: Goal: Emotional status will improve Outcome: Progressing Goal: Mental status will improve Outcome: Progressing   Problem: Activity: Goal: Interest or engagement in activities will improve Outcome: Progressing   Problem: Safety: Goal: Periods of time without injury will increase Outcome: Progressing

## 2023-03-18 NOTE — Plan of Care (Signed)
°  Problem: Education: °Goal: Emotional status will improve °Outcome: Progressing °Goal: Mental status will improve °Outcome: Progressing °Goal: Verbalization of understanding the information provided will improve °Outcome: Progressing °  °

## 2023-03-18 NOTE — Group Note (Signed)
Date:  03/18/2023 Time:  8:29 PM  Group Topic/Focus:  Wrap-Up Group:   The focus of this group is to help patients review their daily goal of treatment and discuss progress on daily workbooks.    Participation Level:  Active  Participation Quality:  Appropriate  Affect:  Appropriate  Cognitive:  Appropriate  Insight: Appropriate  Engagement in Group:  Developing/Improving  Modes of Intervention:  Discussion  Additional Comments:  Pt stated his goal for today was to focus on his treatment plan and discuss his discharge plan with his treatment team. Pt stated he accomplished his goals today. Pt stated he talked with his doctor and social worker about his care today. Pt rated his overall day a 10. Pt stated the plan is for him to discharge on 03/20/2023. Pt stated he contacted his mother, brother and his girl friend today which help improved his overall day.  Pt stated he felt better about himself today. Pt stated he was able to attend all meals. Pt stated he took all medications provided today. Pt stated he attend all groups held today. Pt stated his appetite was pretty good today. Pt rated sleep last night was pretty good. Pt stated the goal tonight was to get some rest. Pt stated he had some physical pain tonight. Pt stated he had some severe pain in both his right and left hip. Pt rated the pain in both hips an 7 on the pain level scale. Pt nurse was updated on the situation.  Pt deny visual hallucinations and auditory issues tonight. Pt denies thoughts of harming himself or others. Pt stated he would alert staff if anything changed   Felipa Furnace 03/18/2023, 8:29 PM
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
## 2023-03-18 NOTE — Plan of Care (Signed)
  Problem: Education: Goal: Emotional status will improve Outcome: Progressing Goal: Mental status will improve Outcome: Progressing   Problem: Coping: Goal: Ability to demonstrate self-control will improve Outcome: Progressing   Problem: Safety: Goal: Periods of time without injury will increase Outcome: Progressing   

## 2023-03-19 ENCOUNTER — Other Ambulatory Visit (HOSPITAL_COMMUNITY): Payer: Self-pay

## 2023-03-19 DIAGNOSIS — F250 Schizoaffective disorder, bipolar type: Secondary | ICD-10-CM

## 2023-03-19 MED ORDER — RISPERIDONE ER 100 MG/0.28ML ~~LOC~~ SUSY
100.0000 mg | PREFILLED_SYRINGE | SUBCUTANEOUS | Status: DC
  Administered 2023-03-20: 100 mg via SUBCUTANEOUS

## 2023-03-19 MED ORDER — WHITE PETROLATUM EX OINT
TOPICAL_OINTMENT | CUTANEOUS | Status: AC
  Administered 2023-03-19: 1
  Filled 2023-03-19: qty 5

## 2023-03-19 NOTE — Plan of Care (Signed)
Problem: Education: Goal: Verbalization of understanding the information provided will improve Outcome: Progressing   Problem: Activity: Goal: Interest or engagement in activities will improve Outcome: Progressing   Problem: Health Behavior/Discharge Planning: Goal: Compliance with treatment plan for underlying cause of condition will improve Outcome: Progressing   Pt reports he slept well with good appetite. Affect flat on initial approach but he does brightens up on interactions. Verbalized concerns about discharge "I just want to go home now. I've been taking my medications. 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 Pt has been compliant with current treatment regimen including scheduled groups. Tolerated medications, meals and fluids well without discomfort. Verbal education done on discharge process including follow up care post discharge, pt verbalized understanding. Safety checks maintained at Q 15 minutes intervals  without outburst. Support, encouragement and reassurance offered.

## 2023-03-19 NOTE — Plan of Care (Signed)
  Problem: Education: Goal: Emotional status will improve Outcome: Progressing Goal: Mental status will improve Outcome: Progressing   Problem: Activity: Goal: Interest or engagement in activities will improve Outcome: Progressing Goal: Sleeping patterns will improve Outcome: Progressing   Problem: Coping: Goal: Ability to demonstrate self-control will improve Outcome: Progressing   

## 2023-03-19 NOTE — Progress Notes (Signed)
   03/19/23 2100  Psych Admission Type (Psych Patients Only)  Admission Status Voluntary  Psychosocial Assessment  Patient Complaints None  Eye Contact Fair  Facial Expression Flat  Affect Appropriate to circumstance  Speech Soft  Interaction Cautious  Motor Activity Slow  Appearance/Hygiene Unremarkable  Behavior Characteristics Cooperative  Mood Pleasant  Aggressive Behavior  Effect No apparent injury  Thought Process  Coherency Circumstantial  Content WDL  Delusions WDL  Perception WDL  Hallucination None reported or observed  Judgment Limited  Confusion None  Danger to Self  Current suicidal ideation? Denies
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
## 2023-03-19 NOTE — Progress Notes (Signed)
   03/19/23 0600  15 Minute Checks  Location Dayroom  Visual Appearance Calm  Behavior Composed  Sleep (Behavioral Health Patients Only)  Calculate sleep? (Click Yes once per 24 hr at 0600 safety check) Yes  Documented sleep last 24 hours 3.75

## 2023-03-19 NOTE — Group Note (Signed)
Recreation Therapy Group Note   Group Topic:Health and Wellness  Group Date: 03/19/2023 Start Time: 1100 End Time: 1125 Facilitators: [name], LRT,CTRS Location: [address] Hall Dayroom   Group Topic: Wellness  Goal Area(s) Addresses:  Patient will define components of whole wellness. Patient will verbalize benefit of whole wellness.  Group Description. Exercise. LRT and patients talked about the importance of physical wellness in self care. Patients took turns leading the group in the exercises and stretches of their choosing. LRT explained to peers they could take breaks or get water as needed.   Education: Wellness, Building control surveyor.   Education Outcome: Acknowledges education/In group clarification offered/Needs additional education.    Affect/Mood: Appropriate   Participation Level: Active   Participation Quality: Independent   Behavior: Appropriate   Speech/Thought Process: Focused   Insight: Good   Judgement: Good   Modes of Intervention: Music   Patient Response to Interventions:  Engaged   Education Outcome:  In group clarification offered    Clinical Observations/Individualized Feedback: Pt was receptive to the activity. Pt had moments in the beginning were he was jokingly stated he couldn't complete the exercises due to having a bad back. Pt completed the exercises that were presented. Pt was called out of group to meet with NP but returned at conclusion of group.     Plan: Continue to engage patient in RT group sessions 2-3x/week.   [name], LRT,CTRS 03/19/2023 1:37 PM
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
## 2023-03-19 NOTE — BHH Group Notes (Signed)
Adult Psychoeducational Group Note  Date:  03/19/2023 Time:  10:05 AM  Group Topic/Focus:  Goals Group:   The focus of this group is to help patients establish daily goals to achieve during treatment and discuss how the patient can incorporate goal setting into their daily lives to aide in recovery.  Participation Level:  Did Not Attend  Participation Quality:    Affect:    Cognitive:    Insight:   Engagement in Group:    Modes of Intervention:    Additional Comments:    Sheran Lawless 03/19/2023, 10:05 AM

## 2023-03-20 ENCOUNTER — Encounter (HOSPITAL_COMMUNITY): Payer: Self-pay

## 2023-03-20 DIAGNOSIS — F250 Schizoaffective disorder, bipolar type: Secondary | ICD-10-CM | POA: Diagnosis not present

## 2023-03-20 MED ORDER — HYDROXYZINE HCL 25 MG PO TABS: 25.0000 mg | ORAL_TABLET | Freq: Three times a day (TID) | ORAL | 0 refills | Status: AC | PRN

## 2023-03-20 MED ORDER — RISPERIDONE 1 MG PO TABS: ORAL_TABLET | ORAL | 0 refills | Status: AC

## 2023-03-20 MED ORDER — PRAZOSIN HCL 1 MG PO CAPS: 1.0000 mg | ORAL_CAPSULE | Freq: Every day | ORAL | 0 refills | Status: AC

## 2023-03-20 MED ORDER — GABAPENTIN 100 MG PO CAPS: 200.0000 mg | ORAL_CAPSULE | Freq: Three times a day (TID) | ORAL | 0 refills | Status: AC

## 2023-03-20 MED ORDER — NICOTINE POLACRILEX 2 MG MT GUM: 2.0000 mg | CHEWING_GUM | OROMUCOSAL | 0 refills | Status: AC | PRN

## 2023-03-20 MED ORDER — RISPERIDONE 1 MG PO TABS: 1.0000 mg | ORAL_TABLET | Freq: Every day | ORAL | Status: DC

## 2023-03-20 MED ORDER — TRAZODONE HCL 50 MG PO TABS: 50.0000 mg | ORAL_TABLET | Freq: Every evening | ORAL | 0 refills | Status: AC | PRN

## 2023-03-20 MED ORDER — UZEDY 100 MG/0.28ML ~~LOC~~ SUSY: 100.0000 mg | PREFILLED_SYRINGE | SUBCUTANEOUS | 0 refills | Status: AC

## 2023-03-20 NOTE — Discharge Instructions (Signed)
-  Follow-up with your outpatient psychiatric provider -instructions on appointment date, time, and address (location) are provided to you in discharge paperwork.  -Take your psychiatric medications as prescribed at discharge - instructions are provided to you in the discharge paperwork You received risperdal uzedy 100 mg LAI on 03-20-2023. Your next dose of riseprdal uzedy 100 mg LAI is due in 30 days, on 04-19-2023.  Your oral risperdal taper is as follows: Wednesday 10-23: take 3 mg at bedtime Thursday 10-24: take 2 mg at bedtime Friday 10-25: take 1 mg at bedtime Saturday 10-26: stop taking oral risperdal  As we discussed, we will provide printed prescriptions at discharge - take these to the pharmacy of your choosing to have your medications filled.   -Follow-up with outpatient primary care doctor and other specialists -for management of preventative medicine and any chronic medical disease.  -Recommend abstinence from alcohol, tobacco, and other illicit drug use at discharge.   -If your psychiatric symptoms recur, worsen, or if you have side effects to your psychiatric medications, call your outpatient psychiatric provider, 911, [phone] or go to the nearest emergency department.  -If suicidal thoughts occur, call your outpatient psychiatric provider, 911, [phone] or go to the nearest emergency department.  Naloxone (Narcan) can help reverse an overdose when given to the victim quickly.  [facility] offers free naloxone kits and instructions/training on its use.  Add naloxone to your first aid kit and you can help save a life.   Pick up your free kit at the following locations:   [place]:  [facility], [address] [place] [address] [phone]) [facility] [address] [place] [address] [phone]) [facility] [address] [place] [address]  [place]: [facility] [address] [place] [address] ([phone]) [facility] [address] [place] [address] [phone])

## 2023-03-20 NOTE — BH IP Treatment Plan (Signed)
Interdisciplinary Treatment and Diagnostic Plan Update  03/20/2023 Time of Session: 841 Roy Dudley MRN: [id number]  Principal Diagnosis: Schizoaffective disorder, bipolar type (HCC)  Secondary Diagnoses: Principal Problem:   Schizoaffective disorder, bipolar type (HCC) Active Problems:   Learning disability   PTSD (post-traumatic stress disorder)   Generalized anxiety disorder   Cannabis use disorder   Current Medications:  Current Facility-Administered Medications  Medication Dose Route Frequency Provider Last Rate Last Admin   acetaminophen (TYLENOL) tablet 650 mg  650 mg Oral Q6H PRN White, Patrice L, NP   650 mg at 03/18/23 0552   alum & mag hydroxide-simeth (MAALOX/MYLANTA) 200-200-20 MG/5ML suspension 30 mL  30 mL Oral Q4H PRN White, Patrice L, NP       diphenhydrAMINE (BENADRYL) capsule 50 mg  50 mg Oral TID PRN White, Patrice L, NP       Or   diphenhydrAMINE (BENADRYL) injection 50 mg  50 mg Intramuscular TID PRN White, Patrice L, NP       feeding supplement (ENSURE ENLIVE / ENSURE PLUS) liquid 237 mL  237 mL Oral TID Sindy Guadeloupe, NP   237 mL at 03/19/23 1655   gabapentin (NEURONTIN) capsule 200 mg  200 mg Oral TID Starleen Blue, NP   200 mg at 03/19/23 2040   haloperidol (HALDOL) tablet 5 mg  5 mg Oral TID PRN White, Patrice L, NP       Or   haloperidol lactate (HALDOL) injection 5 mg  5 mg Intramuscular TID PRN White, Patrice L, NP       hydrOXYzine (ATARAX) tablet 25 mg  25 mg Oral TID PRN White, Patrice L, NP   25 mg at 03/19/23 2040   LORazepam (ATIVAN) tablet 2 mg  2 mg Oral TID PRN White, Patrice L, NP       Or   LORazepam (ATIVAN) injection 2 mg  2 mg Intramuscular TID PRN White, Patrice L, NP       magnesium hydroxide (MILK OF MAGNESIA) suspension 30 mL  30 mL Oral Daily PRN White, Patrice L, NP       nicotine polacrilex (NICORETTE) gum 2 mg  2 mg Oral PRN Sindy Guadeloupe, NP   2 mg at 03/19/23 2040   prazosin (MINIPRESS) capsule 1 mg  1 mg Oral QHS White,  Patrice L, NP   1 mg at 03/19/23 2041   risperiDONE (RISPERDAL M-TABS) disintegrating tablet 2 mg  2 mg Oral QHS Nkwenti, Doris, NP   2 mg at 03/19/23 2040   risperiDONE (RISPERDAL M-TABS) disintegrating tablet 2 mg  2 mg Oral Daily Massengill, Nathan, MD   2 mg at 03/19/23 4098   risperiDONE ER SUSY 100 mg  100 mg Subcutaneous Q28 days Massengill, Harrold Donath, MD       traZODone (DESYREL) tablet 50 mg  50 mg Oral QHS PRN White, Patrice L, NP   50 mg at 03/19/23 2041   PTA Medications: No medications prior to admission.    Patient Stressors: Legal issue   Marital or family conflict   Medication change or noncompliance    Patient Strengths: General fund of knowledge  Motivation for treatment/growth  Supportive family/friends   Treatment Modalities: Medication Management, Group therapy, Case management,  1 to 1 session with clinician, Psychoeducation, Recreational therapy.   Physician Treatment Plan for Primary Diagnosis: Schizoaffective disorder, bipolar type (HCC) Long Term Goal(s): Improvement in symptoms so as ready for discharge   Short Term Goals: Ability to identify changes in lifestyle to reduce recurrence  of condition will improve Ability to verbalize feelings will improve Ability to disclose and discuss suicidal ideas Ability to demonstrate self-control will improve Ability to identify and develop effective coping behaviors will improve Ability to maintain clinical measurements within normal limits will improve Compliance with prescribed medications will improve Ability to identify triggers associated with substance abuse/mental health issues will improve  Medication Management: Evaluate patient's response, side effects, and tolerance of medication regimen.  Therapeutic Interventions: 1 to 1 sessions, Unit Group sessions and Medication administration.  Evaluation of Outcomes: Progressing  Physician Treatment Plan for Secondary Diagnosis: Principal Problem:    Schizoaffective disorder, bipolar type (HCC) Active Problems:   Learning disability   PTSD (post-traumatic stress disorder)   Generalized anxiety disorder   Cannabis use disorder  Long Term Goal(s): Improvement in symptoms so as ready for discharge   Short Term Goals: Ability to identify changes in lifestyle to reduce recurrence of condition will improve Ability to verbalize feelings will improve Ability to disclose and discuss suicidal ideas Ability to demonstrate self-control will improve Ability to identify and develop effective coping behaviors will improve Ability to maintain clinical measurements within normal limits will improve Compliance with prescribed medications will improve Ability to identify triggers associated with substance abuse/mental health issues will improve     Medication Management: Evaluate patient's response, side effects, and tolerance of medication regimen.  Therapeutic Interventions: 1 to 1 sessions, Unit Group sessions and Medication administration.  Evaluation of Outcomes: Progressing   RN Treatment Plan for Primary Diagnosis: Schizoaffective disorder, bipolar type (HCC) Long Term Goal(s): Knowledge of disease and therapeutic regimen to maintain health will improve  Short Term Goals: Ability to remain free from injury will improve, Ability to verbalize frustration and anger appropriately will improve, Ability to demonstrate self-control, Ability to participate in decision making will improve, Ability to verbalize feelings will improve, Ability to disclose and discuss suicidal ideas, Ability to identify and develop effective coping behaviors will improve, and Compliance with prescribed medications will improve  Medication Management: RN will administer medications as ordered by provider, will assess and evaluate patient's response and provide education to patient for prescribed medication. RN will report any adverse and/or side effects to prescribing  provider.  Therapeutic Interventions: 1 on 1 counseling sessions, Psychoeducation, Medication administration, Evaluate responses to treatment, Monitor vital signs and CBGs as ordered, Perform/monitor CIWA, COWS, AIMS and Fall Risk screenings as ordered, Perform wound care treatments as ordered.  Evaluation of Outcomes: Progressing   LCSW Treatment Plan for Primary Diagnosis: Schizoaffective disorder, bipolar type (HCC) Long Term Goal(s): Safe transition to appropriate next level of care at discharge, Engage patient in therapeutic group addressing interpersonal concerns.  Short Term Goals: Engage patient in aftercare planning with referrals and resources, Increase social support, Increase ability to appropriately verbalize feelings, Increase emotional regulation, Facilitate acceptance of mental health diagnosis and concerns, Facilitate patient progression through stages of change regarding substance use diagnoses and concerns, Identify triggers associated with mental health/substance abuse issues, and Increase skills for wellness and recovery  Therapeutic Interventions: Assess for all discharge needs, 1 to 1 time with Social worker, Explore available resources and support systems, Assess for adequacy in community support network, Educate family and significant other(s) on suicide prevention, Complete Psychosocial Assessment, Interpersonal group therapy.  Evaluation of Outcomes: Progressing   Progress in Treatment: Attending groups: Yes. Participating in groups: Yes. Taking medication as prescribed: Yes. Toleration medication: Yes. Family/Significant other contact made: Yes, individual(s) contacted:  [name]  [phone])  Patient understands diagnosis:  Yes. Discussing patient identified problems/goals with staff: Yes. Medical problems stabilized or resolved: Yes. Denies suicidal/homicidal ideation: Yes. Issues/concerns per patient self-inventory: Yes. Other: N/A  New problem(s)  identified: No, Describe:  None Reported  New Short Term/Long Term Goal(s): , medication management for mood stabilization; elimination of SI thoughts; development of comprehensive mental wellness  Patient Goals:   Discharge Plan or Barriers:   Reason for Continuation of Hospitalization: Anxiety Depression Mania Medication stabilization Suicidal ideation  Estimated Length of Stay: 5-7 Days  Last 3 [place] Suicide Severity Risk Score: Flowsheet Row [facility]2023 in [facility] (Discharged) [facility]  C-SSRS RISK CATEGORY No Risk No Risk No Risk       Last PHQ 2/9 Scores:    01/04/2021   11:06 AM  Depression screen PHQ 2/9  Decreased Interest 1  Down, Depressed, Hopeless 2  PHQ - 2 Score 3  Altered sleeping 0  Tired, decreased energy 0  Feeling bad or failure about yourself  2  Trouble concentrating 0  Moving slowly or fidgety/restless 0  Suicidal thoughts 1  PHQ-9 Score 6  Difficult doing work/chores Somewhat difficult    Scribe for Treatment Team: Ane Payment, LCSW 03/20/2023 8:41 AM  medication stabilization, elimination of SI thoughts, development of comprehensive mental wellness plan.

## 2023-03-20 NOTE — Progress Notes (Signed)
  [facility] Adult Case Management Discharge Plan :  [name],  [name]  At discharge, do you have transportation home?: Yes,  [name]  Do you have the ability to pay for your medications: Yes,  Patient has [facility] [employer]   Release of information consent forms completed and in the chart;  [name] needed at discharge.  Patient to Follow up at:  Follow-up Information     [facility] Go on 03/25/2023.   Specialty: Psychology Why: You have an appointment for therapy services on 03/24/24 at 11:00 am, in person. Contact information: [address] [place] [address] [phone]         [facility] Go on 04/15/2023.   Why: You have an appointment for medication management services on 04/15/23 at 1:10 pm, in person.  * [name] Contact information: [address] [place] [address] [phone]                 Next level of care provider has access to [facility] Link:no  Safety Planning and Suicide Prevention discussed: Roy Dudley, Roy Dudley [phone]     Has patient been referred to the Quitline?: Patient refused referral for treatment; a brochure was provided to patient, if he ever decided to reach out for assistance to quit smoking on his own   Patient has been referred for addiction treatment: No known substance use disorder. Patient does smoke marijuana   Roy Dudley, LCSWA 03/20/2023, 9:16 AM
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
## 2023-03-20 NOTE — [facility] Group Notes (Signed)
The focus of this group is to help patients establish daily goals to achieve during treatment and discuss how the patient can incorporate goal setting into their daily lives to aide in recovery.   Pt started group but left shortly after
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
## 2023-03-20 NOTE — Progress Notes (Signed)
D: Pt A & O X 3. Denies SI, HI, AVH and pain at this time. D/C home as ordered. Picked up in lobby by his mother. A: D/C instructions reviewed with pt including prescriptions and follow up appointments; compliance encouraged. All belongings from locker 44 (2 hoodies, black, black & white) given to pt at time of departure. Scheduled  medications given with verbal education and effects monitored. Safety checks maintained without incident till time of d/c.  R: Pt receptive to care. Compliant with medications when offered. Denies adverse drug reactions when assessed. Verbalized understanding related to d/c instructions. Signed belonging sheet in agreement with items received from locker. Ambulatory with a steady gait. Appears to be in no physical distress at time of departure.

## 2023-03-20 NOTE — [facility] Suicide Risk Assessment (Signed)
[facility] INPATIENT:  Family/Significant Other Suicide Prevention Education  Suicide Prevention Education:  Education Completed; Roy Dudley (mom) [phone],  (name of family member/significant other) has been identified by the patient as the family member/significant other with whom the patient will be residing, and identified as the person(s) who will aid the patient in the event of a mental health crisis (suicidal ideations/suicide attempt).  With written consent from the patient, the family member/significant other has been provided the following suicide prevention education, prior to the and/or following the discharge of the patient.  CSW spoke with patient mom and completed safety planning . Mom shared that she did not have any safety concerns or questions and confirmed that patient does not have access to any guns or weapons.   The suicide prevention education provided includes the following: Suicide risk factors Suicide prevention and interventions National Suicide Hotline telephone number [facility] assessment telephone number [facility] Emergency Assistance 911 [facility] and/or Residential Mobile Crisis Unit telephone number  Request made of family/significant other to: Remove weapons (e.g., guns, rifles, knives), all items previously/currently identified as safety concern.   Remove drugs/medications (over-the-counter, prescriptions, illicit drugs), all items previously/currently identified as a safety concern.  The family member/significant other verbalizes understanding of the suicide prevention education information provided.  The family member/significant other agrees to remove the items of safety concern listed above.  Roy Dudley 03/20/2023, 9:23 AM
